# Patient Record
Sex: Female | Born: 1945 | Race: White | Hispanic: No | Marital: Married | Smoking: Never smoker
Health system: Southern US, Community
[De-identification: ages and names within clinical notes are randomized; demographics above are authoritative.]

## PROBLEM LIST (undated history)

## (undated) DIAGNOSIS — R079 Chest pain, unspecified: Secondary | ICD-10-CM

## (undated) DIAGNOSIS — G453 Amaurosis fugax: Secondary | ICD-10-CM

## (undated) DIAGNOSIS — T8859XA Other complications of anesthesia, initial encounter: Secondary | ICD-10-CM

## (undated) DIAGNOSIS — M810 Age-related osteoporosis without current pathological fracture: Secondary | ICD-10-CM

## (undated) DIAGNOSIS — I2510 Atherosclerotic heart disease of native coronary artery without angina pectoris: Secondary | ICD-10-CM

## (undated) DIAGNOSIS — E039 Hypothyroidism, unspecified: Secondary | ICD-10-CM

## (undated) DIAGNOSIS — I6529 Occlusion and stenosis of unspecified carotid artery: Secondary | ICD-10-CM

## (undated) DIAGNOSIS — I10 Essential (primary) hypertension: Secondary | ICD-10-CM

## (undated) DIAGNOSIS — E785 Hyperlipidemia, unspecified: Secondary | ICD-10-CM

## (undated) DIAGNOSIS — E042 Nontoxic multinodular goiter: Secondary | ICD-10-CM

## (undated) DIAGNOSIS — R9439 Abnormal result of other cardiovascular function study: Secondary | ICD-10-CM

## (undated) DIAGNOSIS — H409 Unspecified glaucoma: Secondary | ICD-10-CM

## (undated) DIAGNOSIS — K219 Gastro-esophageal reflux disease without esophagitis: Secondary | ICD-10-CM

## (undated) DIAGNOSIS — G43909 Migraine, unspecified, not intractable, without status migrainosus: Secondary | ICD-10-CM

## (undated) DIAGNOSIS — M1990 Unspecified osteoarthritis, unspecified site: Secondary | ICD-10-CM

## (undated) DIAGNOSIS — T4145XA Adverse effect of unspecified anesthetic, initial encounter: Secondary | ICD-10-CM

## (undated) HISTORY — DX: Hyperlipidemia, unspecified: E78.5

## (undated) HISTORY — DX: Atherosclerotic heart disease of native coronary artery without angina pectoris: I25.10

## (undated) HISTORY — DX: Gastro-esophageal reflux disease without esophagitis: K21.9

## (undated) HISTORY — DX: Age-related osteoporosis without current pathological fracture: M81.0

## (undated) HISTORY — DX: Nontoxic multinodular goiter: E04.2

## (undated) HISTORY — PX: COLONOSCOPY: SHX174

## (undated) HISTORY — DX: Migraine, unspecified, not intractable, without status migrainosus: G43.909

## (undated) HISTORY — DX: Essential (primary) hypertension: I10

## (undated) HISTORY — DX: Occlusion and stenosis of unspecified carotid artery: I65.29

## (undated) HISTORY — PX: TUBAL LIGATION: SHX77

## (undated) HISTORY — DX: Chest pain, unspecified: R07.9

## (undated) HISTORY — PX: WISDOM TOOTH EXTRACTION: SHX21

## (undated) HISTORY — DX: Unspecified glaucoma: H40.9

## (undated) HISTORY — DX: Amaurosis fugax: G45.3

## (undated) HISTORY — DX: Abnormal result of other cardiovascular function study: R94.39

---
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
## 2004-10-18 ENCOUNTER — Encounter: Admission: RE | Admit: 2004-10-18 | Discharge: 2004-10-18 | Payer: Self-pay | Admitting: Internal Medicine

## 2005-04-17 ENCOUNTER — Encounter: Admission: RE | Admit: 2005-04-17 | Discharge: 2005-04-17 | Payer: Self-pay | Admitting: Internal Medicine

## 2005-05-23 ENCOUNTER — Encounter: Admission: RE | Admit: 2005-05-23 | Discharge: 2005-05-23 | Payer: Self-pay | Admitting: Internal Medicine

## 2005-05-23 ENCOUNTER — Encounter (INDEPENDENT_AMBULATORY_CARE_PROVIDER_SITE_OTHER): Payer: Self-pay | Admitting: Specialist

## 2005-05-23 ENCOUNTER — Other Ambulatory Visit: Admission: RE | Admit: 2005-05-23 | Discharge: 2005-05-23 | Payer: Self-pay | Admitting: Interventional Radiology

## 2005-07-16 ENCOUNTER — Encounter: Admission: RE | Admit: 2005-07-16 | Discharge: 2005-07-16 | Payer: Self-pay | Admitting: Internal Medicine

## 2005-09-15 IMAGING — US US BIOPSY
1 series · 10 of 10 positions shown · non-contrast
Comparison: none

CLINICAL DATA: Thyroid goiter.  Enlarging solid lesion in left upper pole.  Requested to perform fine needle aspiration.
 ULTRASOUND GUIDED FINE NEEDLE ASPIRATION LEFT THYROID:
 Comparison thyroid ultrasound 04/17/05 ? [REDACTED].

[Series 1: unknown · 0.06mm/px · 10 of 10 slices shown]
[im 1/10]
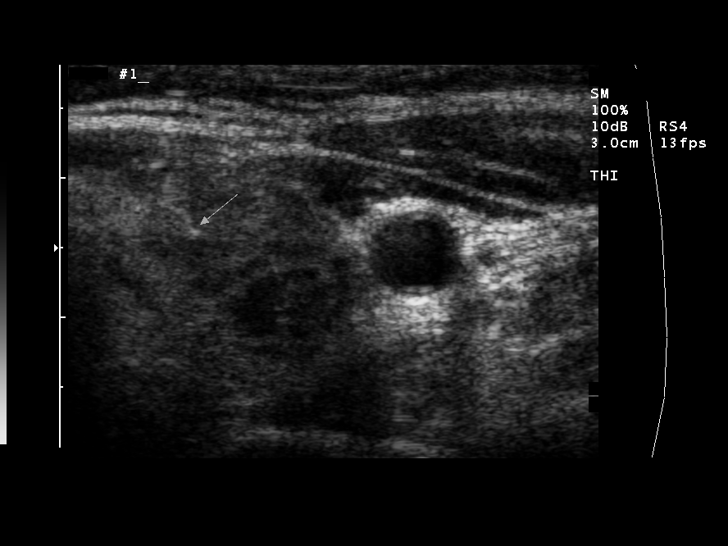
[im 2/10]
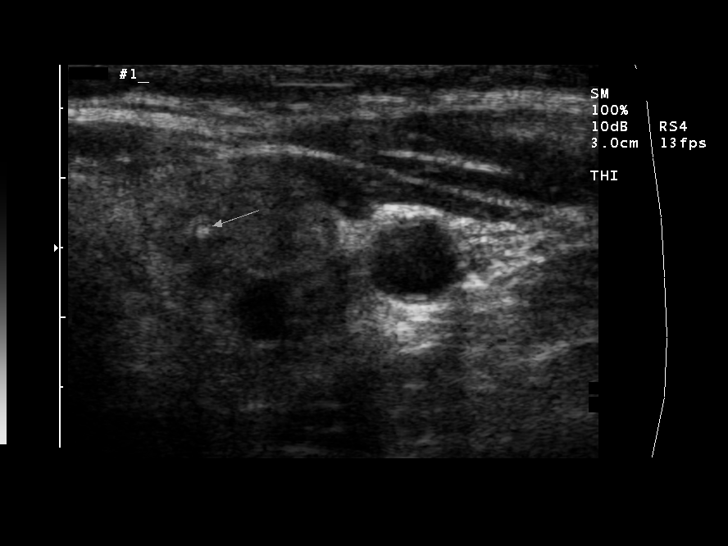
[im 3/10]
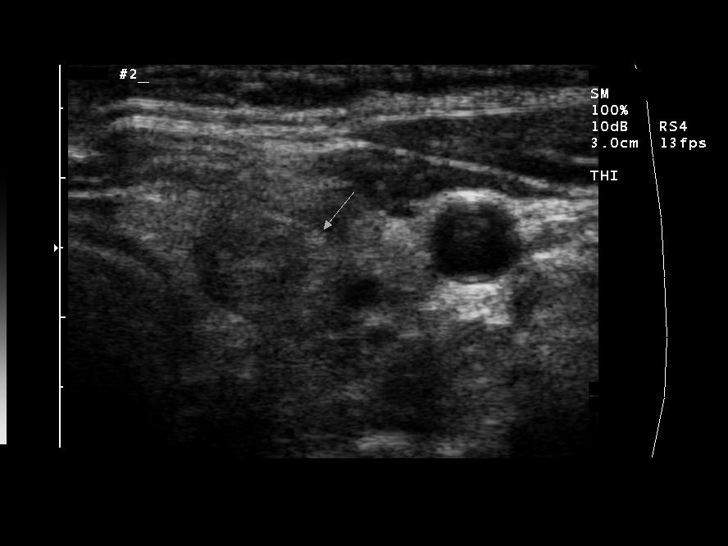
[im 4/10]
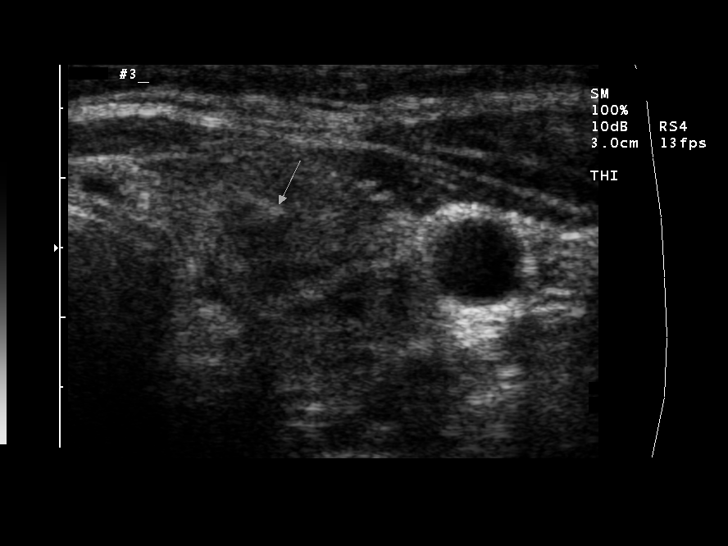
[im 5/10]
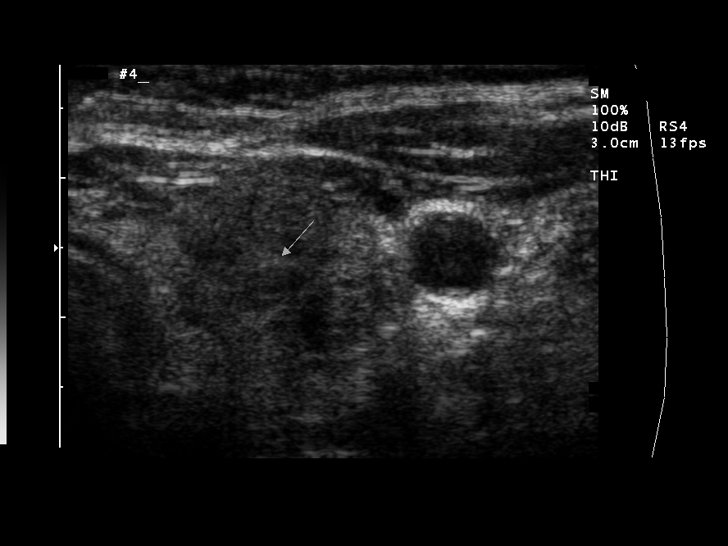
[im 6/10]
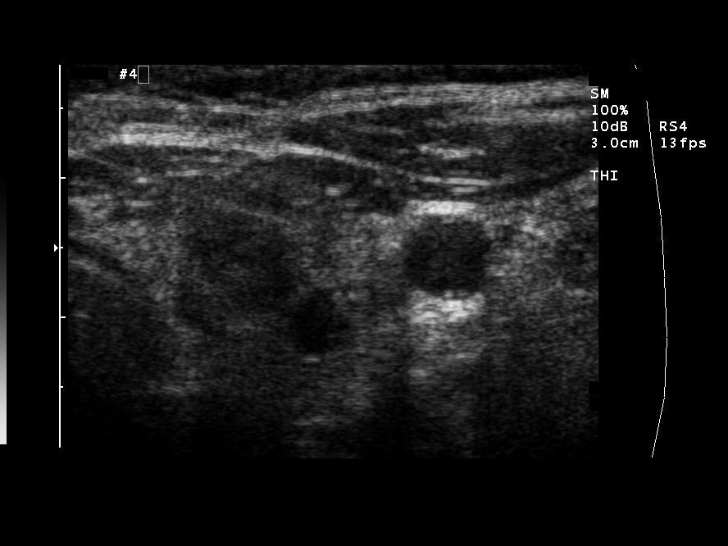
[im 7/10]
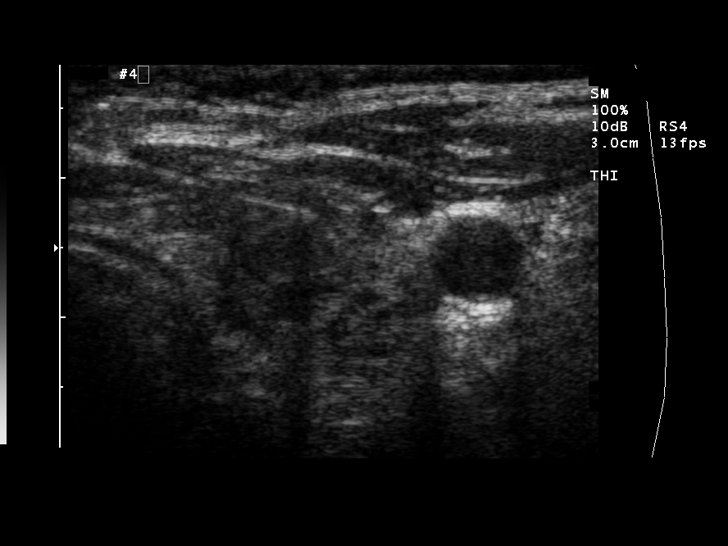
[im 8/10]
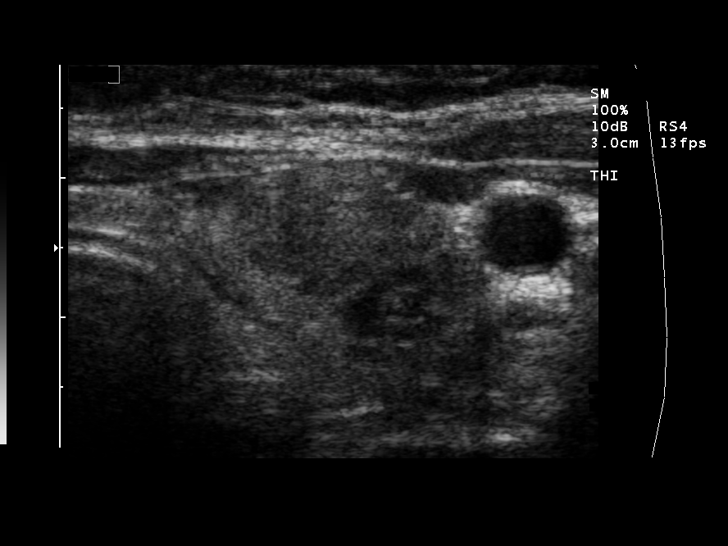
[im 9/10]
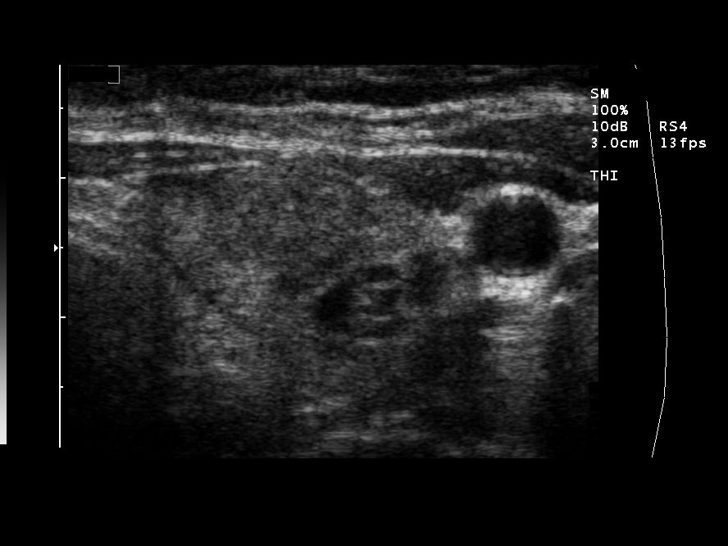
[im 10/10]
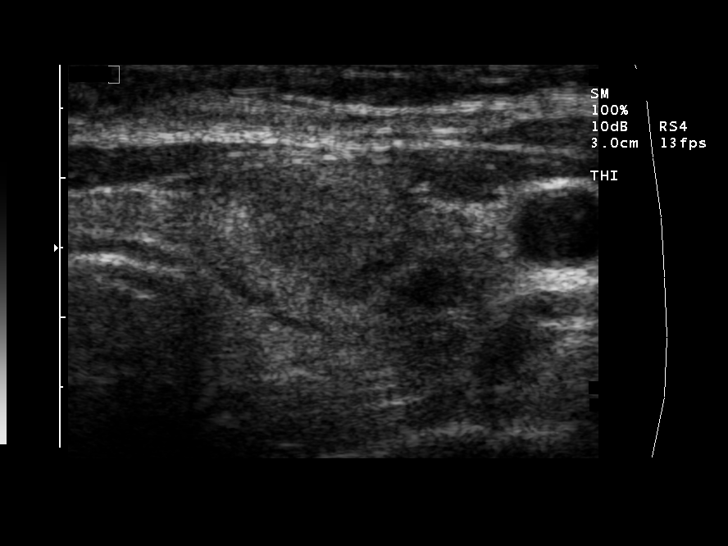

[10 of 10 positions shown; findings below may reference images not displayed]

FINDINGS: Informed consent was obtained from the patient prior to the procedure. 
 Ultrasound was first performed to localize and mark the nodule within the left upper thyroid.  The patient was then prepped and draped in a normal sterile fashion, 1% Lidocaine was used for local anesthesia.  Using direct ultrasound guidance, four passes were made using a 25 gauge hypodermic needle into this nodule. Ultrasound confirmed placement of the needle on all four occasions.  The specimens were given to pathology for further analysis.  Post procedure imaging demonstrated no hematoma or immediate complication. The patient tolerated the procedure well.
IMPRESSION: Ultrasound guided fine needle aspiration solid nodule within left upper thyroid.  Final pathology pending.

## 2006-08-16 ENCOUNTER — Encounter: Admission: RE | Admit: 2006-08-16 | Discharge: 2006-08-16 | Payer: Self-pay | Admitting: Internal Medicine

## 2006-12-09 ENCOUNTER — Encounter: Admission: RE | Admit: 2006-12-09 | Discharge: 2006-12-09 | Payer: Self-pay | Admitting: Internal Medicine

## 2006-12-09 ENCOUNTER — Encounter: Payer: Self-pay | Admitting: Internal Medicine

## 2006-12-12 ENCOUNTER — Encounter: Admission: RE | Admit: 2006-12-12 | Discharge: 2006-12-12 | Payer: Self-pay | Admitting: Internal Medicine

## 2008-01-16 ENCOUNTER — Encounter: Admission: RE | Admit: 2008-01-16 | Discharge: 2008-01-16 | Payer: Self-pay | Admitting: Internal Medicine

## 2008-11-23 ENCOUNTER — Encounter: Admission: RE | Admit: 2008-11-23 | Discharge: 2008-11-23 | Payer: Self-pay | Admitting: Internal Medicine

## 2009-05-17 ENCOUNTER — Encounter: Admission: RE | Admit: 2009-05-17 | Discharge: 2009-05-17 | Payer: Self-pay | Admitting: Internal Medicine

## 2009-11-21 ENCOUNTER — Encounter: Admission: RE | Admit: 2009-11-21 | Discharge: 2009-11-21 | Payer: Self-pay | Admitting: Internal Medicine

## 2009-12-06 ENCOUNTER — Encounter: Admission: RE | Admit: 2009-12-06 | Discharge: 2009-12-06 | Payer: Self-pay | Admitting: Internal Medicine

## 2009-12-06 ENCOUNTER — Other Ambulatory Visit: Admission: RE | Admit: 2009-12-06 | Discharge: 2009-12-06 | Payer: Self-pay | Admitting: Interventional Radiology

## 2010-09-28 ENCOUNTER — Encounter: Admission: RE | Admit: 2010-09-28 | Discharge: 2010-09-28 | Payer: Self-pay | Admitting: Internal Medicine

## 2011-01-21 ENCOUNTER — Encounter: Payer: Self-pay | Admitting: Internal Medicine

## 2012-05-02 ENCOUNTER — Other Ambulatory Visit: Payer: Self-pay | Admitting: Internal Medicine

## 2012-05-02 DIAGNOSIS — E049 Nontoxic goiter, unspecified: Secondary | ICD-10-CM

## 2012-05-09 ENCOUNTER — Ambulatory Visit
Admission: RE | Admit: 2012-05-09 | Discharge: 2012-05-09 | Disposition: A | Discharge status: Home / Self Care | Payer: Medicare Other | Source: Ambulatory Visit | Attending: Internal Medicine | Admitting: Internal Medicine

## 2012-05-09 DIAGNOSIS — E049 Nontoxic goiter, unspecified: Secondary | ICD-10-CM

## 2012-05-20 ENCOUNTER — Other Ambulatory Visit: Payer: Self-pay | Admitting: Internal Medicine

## 2012-05-20 DIAGNOSIS — E041 Nontoxic single thyroid nodule: Secondary | ICD-10-CM

## 2012-05-29 ENCOUNTER — Ambulatory Visit
Admission: RE | Admit: 2012-05-29 | Discharge: 2012-05-29 | Disposition: A | Discharge status: Home / Self Care | Payer: Medicare Other | Source: Ambulatory Visit | Attending: Internal Medicine | Admitting: Internal Medicine

## 2012-05-29 ENCOUNTER — Other Ambulatory Visit (HOSPITAL_COMMUNITY)
Admission: RE | Admit: 2012-05-29 | Discharge: 2012-05-29 | Disposition: A | Discharge status: Home / Self Care | Payer: Medicare Other | Source: Ambulatory Visit | Attending: Radiology | Admitting: Radiology

## 2012-05-29 DIAGNOSIS — E041 Nontoxic single thyroid nodule: Secondary | ICD-10-CM

## 2012-05-29 DIAGNOSIS — E049 Nontoxic goiter, unspecified: Secondary | ICD-10-CM | POA: Insufficient documentation

## 2012-05-29 NOTE — Procedures (Signed)
US guided FNA X 4 right lower pole thyroid nodule via 25 gauge needles. No immediate complications. Pathology pending.

## 2012-08-11 ENCOUNTER — Ambulatory Visit
Admission: RE | Admit: 2012-08-11 | Discharge: 2012-08-11 | Disposition: A | Discharge status: Home / Self Care | Payer: Medicare Other | Source: Ambulatory Visit | Attending: Internal Medicine | Admitting: Internal Medicine

## 2012-08-11 ENCOUNTER — Other Ambulatory Visit: Payer: Self-pay | Admitting: Internal Medicine

## 2012-08-11 DIAGNOSIS — R51 Headache: Secondary | ICD-10-CM

## 2013-03-10 ENCOUNTER — Other Ambulatory Visit: Payer: Self-pay | Admitting: Internal Medicine

## 2013-03-19 ENCOUNTER — Ambulatory Visit
Admission: RE | Admit: 2013-03-19 | Discharge: 2013-03-19 | Disposition: A | Discharge status: Home / Self Care | Payer: Medicare Other | Source: Ambulatory Visit | Attending: Internal Medicine | Admitting: Internal Medicine

## 2013-03-19 DIAGNOSIS — M810 Age-related osteoporosis without current pathological fracture: Secondary | ICD-10-CM

## 2013-03-30 ENCOUNTER — Other Ambulatory Visit: Payer: Self-pay | Admitting: Internal Medicine

## 2013-03-30 ENCOUNTER — Ambulatory Visit
Admission: RE | Admit: 2013-03-30 | Discharge: 2013-03-30 | Disposition: A | Discharge status: Home / Self Care | Payer: Medicare Other | Source: Ambulatory Visit | Attending: Internal Medicine | Admitting: Internal Medicine

## 2013-03-30 DIAGNOSIS — R509 Fever, unspecified: Secondary | ICD-10-CM

## 2013-03-30 DIAGNOSIS — R05 Cough: Secondary | ICD-10-CM

## 2014-02-04 ENCOUNTER — Other Ambulatory Visit: Payer: Self-pay | Admitting: Internal Medicine

## 2014-02-04 ENCOUNTER — Ambulatory Visit
Admission: RE | Admit: 2014-02-04 | Discharge: 2014-02-04 | Disposition: A | Discharge status: Home / Self Care | Payer: 59 | Source: Ambulatory Visit | Attending: Internal Medicine | Admitting: Internal Medicine

## 2014-02-04 DIAGNOSIS — R52 Pain, unspecified: Secondary | ICD-10-CM

## 2014-02-04 DIAGNOSIS — W19XXXA Unspecified fall, initial encounter: Secondary | ICD-10-CM

## 2014-05-13 ENCOUNTER — Ambulatory Visit (INDEPENDENT_AMBULATORY_CARE_PROVIDER_SITE_OTHER): Payer: 59 | Admitting: Otolaryngology

## 2016-01-09 DIAGNOSIS — R9439 Abnormal result of other cardiovascular function study: Secondary | ICD-10-CM

## 2016-01-09 DIAGNOSIS — R079 Chest pain, unspecified: Secondary | ICD-10-CM

## 2016-01-09 HISTORY — DX: Abnormal result of other cardiovascular function study: R94.39

## 2016-01-09 HISTORY — DX: Chest pain, unspecified: R07.9

## 2016-01-17 HISTORY — PX: CARDIAC CATHETERIZATION: SHX172

## 2016-01-30 DIAGNOSIS — I2510 Atherosclerotic heart disease of native coronary artery without angina pectoris: Secondary | ICD-10-CM

## 2016-01-30 HISTORY — DX: Atherosclerotic heart disease of native coronary artery without angina pectoris: I25.10

## 2018-02-03 DIAGNOSIS — R079 Chest pain, unspecified: Secondary | ICD-10-CM | POA: Diagnosis not present

## 2018-02-04 DIAGNOSIS — R079 Chest pain, unspecified: Secondary | ICD-10-CM | POA: Diagnosis not present

## 2018-02-06 DIAGNOSIS — G453 Amaurosis fugax: Secondary | ICD-10-CM

## 2018-02-06 DIAGNOSIS — K219 Gastro-esophageal reflux disease without esophagitis: Secondary | ICD-10-CM | POA: Insufficient documentation

## 2018-02-06 DIAGNOSIS — E042 Nontoxic multinodular goiter: Secondary | ICD-10-CM

## 2018-02-06 DIAGNOSIS — M810 Age-related osteoporosis without current pathological fracture: Secondary | ICD-10-CM

## 2018-02-06 DIAGNOSIS — G43909 Migraine, unspecified, not intractable, without status migrainosus: Secondary | ICD-10-CM

## 2018-02-06 DIAGNOSIS — I10 Essential (primary) hypertension: Secondary | ICD-10-CM | POA: Insufficient documentation

## 2018-02-06 DIAGNOSIS — H409 Unspecified glaucoma: Secondary | ICD-10-CM

## 2018-02-06 DIAGNOSIS — E785 Hyperlipidemia, unspecified: Secondary | ICD-10-CM

## 2018-02-06 DIAGNOSIS — I6529 Occlusion and stenosis of unspecified carotid artery: Secondary | ICD-10-CM

## 2018-02-06 HISTORY — DX: Gastro-esophageal reflux disease without esophagitis: K21.9

## 2018-02-06 HISTORY — DX: Migraine, unspecified, not intractable, without status migrainosus: G43.909

## 2018-02-06 HISTORY — DX: Hyperlipidemia, unspecified: E78.5

## 2018-02-06 HISTORY — DX: Occlusion and stenosis of unspecified carotid artery: I65.29

## 2018-02-06 HISTORY — DX: Essential (primary) hypertension: I10

## 2018-02-06 HISTORY — DX: Nontoxic multinodular goiter: E04.2

## 2018-02-06 HISTORY — DX: Unspecified glaucoma: H40.9

## 2018-02-06 HISTORY — DX: Amaurosis fugax: G45.3

## 2018-02-06 HISTORY — DX: Age-related osteoporosis without current pathological fracture: M81.0

## 2018-02-11 NOTE — Progress Notes (Signed)
Cardiology Office Note:    Date:  02/12/2018   ID:  Andrea Ewing, DOB 11-24-1946, MRN [id number]  PCP:  Ralene OkMoreira, Roy, MD  Cardiologist:  Norman HerrlichBrian Alijah Hyde, MD    Referring MD: No ref. provider found    ASSESSMENT:    1. Mild CAD   2. Essential hypertension   3. Hyperlipidemia, unspecified hyperlipidemia type    PLAN:    In order of problems listed above:  1. Stable continue current medical treatment.  In particular I do not think she requires a redo coronary angiography.  In the future I asked her to wait at least 1-2 hours after resting on the couch taking nitroglycerin to avoid repeat symptomatic hypotension. 2. Stable continue current treatment 3. Stable continue her statin   Next appointment: 6 months   Medication Adjustments/Labs and Tests Ordered: Current medicines are reviewed at length with the patient today.  Concerns regarding medicines are outlined above.  No orders of the defined types were placed in this encounter.  No orders of the defined types were placed in this encounter.   Chief Complaint  Patient presents with  . Chest Pain    recent [facility] admission and MPI    History of Present Illness:    Andrea Ewing is a 72 y.o. female with a hx of mild nonobstructive CAD at angiography January 2017, Dyslipidemia, HTN   last seen 04/04/17.[facility] with chest pain.Troponin was normal, stress MPI was normal.  Left heart cath 01/17/16: Conclusions  Diagnostic Procedure Summary  Mild non obstructive disease in the LAD and LCX  Cardiac Arteries and Lesion Findings  LMCA: Mild luminal irregularities < 20%  LAD: 30 % mid LAD  LCx: LCX 30%  RCA: Mild luminal irregularities < 20%  Compliance with diet, lifestyle and medications: yes  She relates to me that on the day of admission to the hospital she had angina in the morning relieved with nitroglycerin within an hour she was in the urology office statin mobile for 30 minutes and became  very weak felt as if she would faint and had intense nausea because of this she went to the emergency room and was admitted.  She is not having frequent angina.  She complains bitterly of bowel dysfunction with severe constipation bloating abdominal distention and reflux.  She request referral to gastroenterology. Past Medical History:  Diagnosis Date  . Abnormal myocardial perfusion study 01/09/2016   Overview:  4.6 Mets, EKG normal. inferobasilar ischemia, EF 74%  . 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 Carotid atherosclerosis 02/06/2018  . Chest pain in adult 01/09/2016  . Essential hypertension 02/06/2018  . GERD (gastroesophageal reflux disease) 02/06/2018  . Glaucoma 02/06/2018  . Hyperlipidemia 02/06/2018  . Migraine 02/06/2018  . Mild CAD 01/30/2016   Overview:  Cardiac cath 01/17/16:Conclusions Diagnostic Procedure Summary Mild non obstructive disease in the LAD and LCX  . Multinodular goiter 02/06/2018  . Osteoporosis 02/06/2018    Past Surgical History:  Procedure Laterality Date  . CARDIAC CATHETERIZATION  01/17/2016    Current Medications: Current Meds  Medication Sig  . amLODipine-benazepril (LOTREL) 5-10 MG capsule Take 1 capsule by mouth daily.  Marland Kitchen. aspirin EC 81 MG tablet Take 81 mg by mouth daily.  Marland Kitchen. atorvastatin (LIPITOR) 20 MG tablet Take 20 mg by mouth daily.  . bimatoprost (LUMIGAN) 0.01 % SOLN Place 1 drop into both eyes at bedtime.  Marland Kitchen. lactulose (CHRONULAC) 10 GM/15ML solution   . metoprolol tartrate (LOPRESSOR) 25 MG tablet  Take 1 tablet by mouth 2 (two) times daily.  . nitroGLYCERIN (NITROSTAT) 0.4 MG SL tablet Place 1 tablet under the tongue every 5 (five) minutes as needed.  . sucralfate (CARAFATE) 1 g tablet Take 1 tablet by mouth daily.     Allergies:   Patient has no known allergies.   Social History   Socioeconomic History  . Marital status: Married    Spouse name: None  . Number of children: None  . Years of education: None  . Highest education level: None  Social Needs    . Financial resource strain: None  . Food insecurity - worry: None  . Food insecurity - inability: None  . Transportation needs - medical: None  . Transportation needs - non-medical: None  Occupational History  . None  Tobacco Use  . Smoking status: Never Smoker  . Smokeless tobacco: Never Used  Substance and Sexual Activity  . Alcohol use: No    Frequency: Never  . Drug use: No  . Sexual activity: None  Other Topics Concern  . None  Social History Narrative  . None     Family History: The patient's family history includes COPD in her mother; Diabetes in her brother, father, and mother; Heart disease in her maternal grandfather, maternal grandmother, mother, paternal grandmother, and paternal uncle. ROS:   Please see the history of present illness.    All other systems reviewed and are negative.  EKGs/Labs/Other Studies Reviewed:    The following studies were reviewed today: [facility] at [facility] nonspecific T waves EKG:  EKG ordered today.  The ekg ordered today demonstrates [facility] and normal MPI 02/04/18 Normal EKG perfusion and LV at 7 mets. Recent Labs: troponin<0.01, CBC BMP normal No results found for requested labs within last 8760 hours.  Recent Lipid Panel No results found for: CHOL, TRIG, HDL, CHOLHDL, VLDL, LDLCALC, LDLDIRECT  Physical Exam:    VS:  BP 126/84 (BP Location: Right Arm, Patient Position: Sitting, Cuff Size: Normal)   Pulse 60   Ht 5' 3.75" (1.619 m)   Wt 182 lb 6.4 oz (82.7 kg)   SpO2 99%   BMI 31.55 kg/m     Wt Readings from Last 3 Encounters:  02/12/18 182 lb 6.4 oz (82.7 kg)     GEN:  Well nourished, well developed in no acute distress HEENT: Normal NECK: No JVD; No carotid bruits LYMPHATICS: No lymphadenopathy CARDIAC: RRR, no murmurs, rubs, gallops RESPIRATORY:  Clear to auscultation without rales, wheezing or rhonchi  ABDOMEN: Soft, non-tender, non-distended MUSCULOSKELETAL:  No edema; No deformity  SKIN: Warm and dry NEUROLOGIC:   Alert and oriented x 3 PSYCHIATRIC:  Normal affect    Signed, Norman Herrlich, MD  02/12/2018 8:51 AM    [facility]

## 2018-02-12 ENCOUNTER — Encounter: Payer: Self-pay | Admitting: Cardiology

## 2018-02-12 ENCOUNTER — Ambulatory Visit (INDEPENDENT_AMBULATORY_CARE_PROVIDER_SITE_OTHER): Payer: Medicare Other | Admitting: Cardiology

## 2018-02-12 VITALS — BP 126/84 | HR 60 | Ht 63.75 in | Wt 182.4 lb

## 2018-02-12 DIAGNOSIS — I2510 Atherosclerotic heart disease of native coronary artery without angina pectoris: Secondary | ICD-10-CM

## 2018-02-12 DIAGNOSIS — I10 Essential (primary) hypertension: Secondary | ICD-10-CM

## 2018-02-12 DIAGNOSIS — E785 Hyperlipidemia, unspecified: Secondary | ICD-10-CM | POA: Diagnosis not present

## 2018-02-12 DIAGNOSIS — K219 Gastro-esophageal reflux disease without esophagitis: Secondary | ICD-10-CM | POA: Diagnosis not present

## 2018-02-12 NOTE — Patient Instructions (Addendum)
Medication Instructions:  Your physician recommends that you continue on your current medications as directed. Please refer to the Current Medication list given to you today.  Labwork: None  Testing/Procedures: You had an EKG today.  Follow-Up: Your physician wants you to follow-up in: 6 months. You will receive a reminder letter in the mail two months in advance. If you don't receive a letter, please call our office to schedule the follow-up appointment.  Any Other Special Instructions Will Be Listed Below (If Applicable).     If you need a refill on your cardiac medications before your next appointment, please call your pharmacy.    Nitroglycerin sublingual powder What is this medicine? NITROGLYCERIN (nye troe GLI ser in) is a type of vasodilator. It relaxes blood vessels, increasing the blood and oxygen supply to your heart. This medicine is used to prevent or relieve chest pain caused by angina. It is also used to prevent chest pain before activities like climbing stairs, going outdoors in cold weather, or sexual activity. This medicine may be used for other purposes; ask your health care provider or pharmacist if you have questions. COMMON BRAND NAME(S): GONITRO What should I tell my health care provider before I take this medicine? They need to know if you have any of these conditions: -anemia -bleeding in the brain -head injury -heart disease -liver disease -an unusual or allergic reaction to nitroglycerin, other medicines, foods, dyes, or preservatives -pregnant or trying to get pregnant -breast-feeding How should I use this medicine? This medicine is only for use in the mouth. Follow the directions on the prescription label. Hold the packet upright with the notch and red arrow line at the top of the packet. Tap the bottom of the packet so the powder settles at the bottom. Hold the packet at the notch and hold as close to your mouth as possible. Tear along the red arrow  line. Lift up your tongue. Pour all of the powder in the packet under your tongue. Close your mouth tight right away and breathe normally through your nose. Allow the powder to dissolve before you swallow. Do not rinse your mouth or spit for 5 minutes after taking this medicine. You can repeat with 1 packet every 5 minutes for up to 3 packets. If you do not feel better after 3 packets, call 911 immediately. Do not take your medicine more often than directed. If you take this medicine often to relieve symptoms of angina, your doctor or health care professional may provide you with different instructions to manage your symptoms. If symptoms do not go away after following these instructions, call 911 immediately. Do not take more than 3 packets over 15 minutes. Talk to your pediatrician regarding the use of this medicine in children. Special care may be needed. Overdosage: If you think you have taken too much of this medicine contact a poison control center or emergency room at once. NOTE: This medicine is only for you. Do not share this medicine with others. What if I miss a dose? This does not apply; this medicine is not for regular use. What may interact with this medicine? Do not take this medicine with any of the following medications: -certain medicines for erectile dysfunction like avanafil, sildenafil, tadalafil, and vardenafil -ergot alkaloids like dihydroergotamine, ergonovine, ergotamine, methylergonovine -riociguat This medicine may also interact with the following medications: -certain medicines for blood pressure This list may not describe all possible interactions. Give your health care provider a list of all the medicines,  herbs, non-prescription drugs, or dietary supplements you use. Also tell them if you smoke, drink alcohol, or use illegal drugs. Some items may interact with your medicine. What should I watch for while using this medicine? Tell your doctor or health care professional  if your symptoms do not start to get better or if you feel worse. Keep this medicine with you at all times. Sit down when you take your medicine to prevent falling if you feel dizzy or faint after using it. Try to remain calm. This will help you to feel better faster. If you feel dizzy, take several deep breaths and lie down with your feet propped up, or bend forward with your head resting between your knees. You may get drowsy or dizzy. Do not drive, use machinery, or do anything that needs mental alertness until you know how this drug affects you. Do not stand or sit up quickly, especially if you are an older patient. This reduces the risk of dizzy or fainting spells. Do not treat yourself for coughs, colds, or pain while you are taking this medicine without asking your doctor or health care professional for advice. Some ingredients may increase your blood pressure. What side effects may I notice from receiving this medicine? Side effects that you should report to your doctor or health care professional as soon as possible: -allergic reactions like skin rash, itching or hives, swelling of the face, lips, or tongue -signs and symptoms of anemia like breathing problems; dizziness; unusually weak or tired -signs and symptoms of low blood pressure like dizziness; feeling faint or lightheaded; falls; unusually weak or tired Side effects that usually do not require medical attention (report to your doctor or health care professional if they continue or are bothersome): -facial flushing -headache -nausea, vomiting This list may not describe all possible side effects. Call your doctor for medical advice about side effects. You may report side effects to FDA at 1-800-FDA-1088. Where should I keep my medicine? Keep out of the reach of children. Store between 5 and 40 degrees C (41 and 104 degrees F). Throw away any unused medicine after the expiration date. NOTE: This sheet is a summary. It may not cover  all possible information. If you have questions about this medicine, talk to your doctor, pharmacist, or health care provider.  2018 Elsevier/Gold Standard (2016-01-18 17:46:23)

## 2018-04-14 ENCOUNTER — Other Ambulatory Visit: Payer: Self-pay

## 2018-04-14 MED ORDER — METOPROLOL TARTRATE 25 MG PO TABS: 25.0000 mg | ORAL_TABLET | Freq: Two times a day (BID) | ORAL | 3 refills | Status: DC

## 2018-09-12 ENCOUNTER — Encounter: Payer: Self-pay | Admitting: *Deleted

## 2018-09-12 ENCOUNTER — Ambulatory Visit (INDEPENDENT_AMBULATORY_CARE_PROVIDER_SITE_OTHER): Payer: Medicare Other | Admitting: Cardiology

## 2018-09-12 ENCOUNTER — Encounter: Payer: Self-pay | Admitting: Cardiology

## 2018-09-12 VITALS — BP 102/68 | HR 68 | Ht 63.75 in | Wt 173.4 lb

## 2018-09-12 DIAGNOSIS — I2510 Atherosclerotic heart disease of native coronary artery without angina pectoris: Secondary | ICD-10-CM | POA: Diagnosis not present

## 2018-09-12 DIAGNOSIS — E785 Hyperlipidemia, unspecified: Secondary | ICD-10-CM | POA: Diagnosis not present

## 2018-09-12 DIAGNOSIS — I10 Essential (primary) hypertension: Secondary | ICD-10-CM

## 2018-09-12 NOTE — Patient Instructions (Signed)
Medication Instructions:  Your physician recommends that you continue on your current medications as directed. Please refer to the Current Medication list given to you today.   Labwork: None  Testing/Procedures: None  Follow-Up: Your physician wants you to follow-up in: 1 year. You will receive a reminder letter in the mail two months in advance. If you don't receive a letter, please call our office to schedule the follow-up appointment.   If you need a refill on your cardiac medications before your next appointment, please call your pharmacy.   Thank you for choosing [facility]! Apollo Timothy, RN [phone]    

## 2018-09-12 NOTE — Progress Notes (Signed)
Cardiology Office Note:    Date:  09/12/2018   ID:  Andrea Ewing, DOB [birth year], MRN [id number]  PCP:  Ralene Ok, MD  Cardiologist:  Norman Herrlich, MD    Referring MD: Ralene Ok, MD    ASSESSMENT:    1. Mild CAD   2. Hyperlipidemia, unspecified hyperlipidemia type   3. Essential hypertension    PLAN:    In order of problems listed above:  1. Her CAD is chronic stable New [place] Heart Association class I continue medical treatment including aspirin calcium channel blocker statin at this time I do not think she requires an ischemic evaluation.  I will plan to see in the office in 1 year and asked her to contact me if she is having frequent episodes of angina. 2. Stable continue her statin recent labs for liver function toxicity lipid profile FACP requested from her PCP 3. Hypertension is stable continue her current treatment calcium channel blocker   Next appointment: One year unless she is having frequent angina   Medication Adjustments/Labs and Tests Ordered: Current medicines are reviewed at length with the patient today.  Concerns regarding medicines are outlined above.  No orders of the defined types were placed in this encounter.  No orders of the defined types were placed in this encounter.   Chief Complaint  Patient presents with  . Coronary Artery Disease    History of Present Illness:    Andrea Ewing is a 72 y.o. female with a hx of mild nonobstructive CAD at angiography January 2017, Dyslipidemia, HTN   last seen 02/12/18.She was admitted to [facility] 02/03/18 with chest pain.Troponin was normal, stress MPI was normal.   Left heart cath 01/17/16: Conclusions   Diagnostic Procedure Summary   Mild non obstructive disease in the LAD and LCX   Cardiac Arteries and Lesion Findings   LMCA: Mild luminal irregularities < 20%   LAD: 30 % mid LAD   LCx: LCX 30%   RCA: Mild luminal irregularities < 20%  Compliance with diet, lifestyle and medications: Yes  She  has been troubled particularly by allergies which persisted later into the summer.  She is not short of breath no edema orthopnea chest pain palpitation or syncope and tolerates her medications without side effects.  She has had no further hospital follow-ups. Past Medical History:  Diagnosis Date  . Abnormal myocardial perfusion study 01/09/2016   Overview:  4.6 Mets, EKG normal. inferobasilar ischemia, EF 74%  . 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 Carotid atherosclerosis 02/06/2018  . Chest pain in adult 01/09/2016  . Essential hypertension 02/06/2018  . GERD (gastroesophageal reflux disease) 02/06/2018  . Glaucoma 02/06/2018  . Hyperlipidemia 02/06/2018  . Migraine 02/06/2018  . Mild CAD 01/30/2016   Overview:  Cardiac cath 01/17/16:Conclusions Diagnostic Procedure Summary Mild non obstructive disease in the LAD and LCX  . Multinodular goiter 02/06/2018  . Osteoporosis 02/06/2018    Past Surgical History:  Procedure Laterality Date  . CARDIAC CATHETERIZATION  01/17/2016    Current Medications: Current Meds  Medication Sig  . amLODipine-benazepril (LOTREL) 5-10 MG capsule Take 1 capsule by mouth daily.  Marland Kitchen aspirin EC 81 MG tablet Take 81 mg by mouth daily.  Marland Kitchen atorvastatin (LIPITOR) 20 MG tablet Take 20 mg by mouth daily.  . bimatoprost (LUMIGAN) 0.01 % SOLN Place 1 drop into both eyes at bedtime.  . Calcium Carb-Cholecalciferol (CALCIUM + VITAMIN D3 PO) Take 1 tablet by mouth daily.  Marland Kitchen denosumab (PROLIA) 60 MG/ML SOSY injection  Inject 60 mg into the skin every 6 (six) months.   . esomeprazole (NEXIUM) 40 MG capsule Take 40 mg by mouth daily.   . metoprolol tartrate (LOPRESSOR) 25 MG tablet Take 1 tablet (25 mg total) by mouth 2 (two) times daily.  . Multiple Vitamin (MULTIVITAMIN) tablet Take 1 tablet by mouth daily.  . nitroGLYCERIN (NITROSTAT) 0.4 MG SL tablet Place 1 tablet under the tongue every 5 (five) minutes as needed.  Marland Kitchen Peppermint Oil (IBGARD PO) Take 1 tablet by mouth 3 (three) times daily.      Allergies:   Patient has no known allergies.   Social History   Socioeconomic History  . Marital status: Married    Spouse name: Not on file  . Number of children: Not on file  . Years of education: Not on file  . Highest education level: Not on file  Occupational History  . Not on file  Social Needs  . Financial resource strain: Not on file  . Food insecurity:    Worry: Not on file    Inability: Not on file  . Transportation needs:    Medical: Not on file    Non-medical: Not on file  Tobacco Use  . Smoking status: Never Smoker  . Smokeless tobacco: Never Used  Substance and Sexual Activity  . Alcohol use: No    Frequency: Never  . Drug use: No  . Sexual activity: Not on file  Lifestyle  . Physical activity:    Days per week: Not on file    Minutes per session: Not on file  . Stress: Not on file  Relationships  . Social connections:    Talks on phone: Not on file    Gets together: Not on file    Attends religious service: Not on file    Active member of club or organization: Not on file    Attends meetings of clubs or organizations: Not on file    Relationship status: Not on file  Other Topics Concern  . Not on file  Social History Narrative  . Not on file     Family History: The patient's family history includes COPD in her mother; Diabetes in her brother, father, and mother; Heart disease in her maternal grandfather, maternal grandmother, mother, paternal grandmother, and paternal uncle. ROS:   Please see the history of present illness.    All other systems reviewed and are negative.  EKGs/Labs/Other Studies Reviewed:    The following studies were reviewed today:  EKG:  EKG ordered today.  The ekg ordered today demonstrates [facility] normal  Recent Labs: No results found for requested labs within last 8760 hours.  Recent Lipid Panel No results found for: CHOL, TRIG, HDL, CHOLHDL, VLDL, LDLCALC, LDLDIRECT  Physical Exam:    VS:  BP 102/68 (BP  Location: Right Arm, Patient Position: Sitting, Cuff Size: Normal)   Pulse 68   Ht 5' 3.75" (1.619 m)   Wt 173 lb 6.4 oz (78.7 kg)   SpO2 98%   BMI 30.00 kg/m     Wt Readings from Last 3 Encounters:  09/12/18 173 lb 6.4 oz (78.7 kg)  02/12/18 182 lb 6.4 oz (82.7 kg)     GEN:  Well nourished, well developed in no acute distress HEENT: Normal NECK: No JVD; No carotid bruits LYMPHATICS: No lymphadenopathy CARDIAC: RRR, no murmurs, rubs, gallops RESPIRATORY:  Clear to auscultation without rales, wheezing or rhonchi  ABDOMEN: Soft, non-tender, non-distended MUSCULOSKELETAL:  No edema; No deformity  SKIN:  Warm and dry NEUROLOGIC:  Alert and oriented x 3 PSYCHIATRIC:  Normal affect    Signed, Norman HerrlichBrian Katelyne Galster, MD  09/12/2018 3:07 PM    [facility]

## 2019-04-23 ENCOUNTER — Telehealth: Payer: Self-pay | Admitting: Cardiology

## 2019-04-23 MED ORDER — METOPROLOL TARTRATE 25 MG PO TABS: 25.0000 mg | ORAL_TABLET | Freq: Two times a day (BID) | ORAL | 2 refills | Status: DC

## 2019-04-23 NOTE — Telephone Encounter (Signed)
Lopressor refill sent to [facility] [address] Per pt preference

## 2019-04-23 NOTE — Telephone Encounter (Signed)
°*  STAT* If patient is at the pharmacy, call can be transferred to refill team.   1. Which medications need to be refilled? (please list name of each medication and dose if known)metoprolol tartrate (LOPRESSOR) 25 MG tablet  2. Which pharmacy/location (including street and city if local pharmacy) is medication to be sent to? 3. Do they need a 30 day or 90 day supply?  [id number] - [place], [place] - [address] [phone] (Phone) [phone] (Fax)

## 2019-07-27 DIAGNOSIS — M545 Low back pain, unspecified: Secondary | ICD-10-CM | POA: Insufficient documentation

## 2019-07-27 HISTORY — DX: Low back pain, unspecified: M54.50

## 2019-08-27 ENCOUNTER — Other Ambulatory Visit: Payer: Self-pay | Admitting: Internal Medicine

## 2019-08-27 ENCOUNTER — Ambulatory Visit
Admission: RE | Admit: 2019-08-27 | Discharge: 2019-08-27 | Disposition: A | Discharge status: Home / Self Care | Payer: Medicare Other | Source: Ambulatory Visit | Attending: Internal Medicine | Admitting: Internal Medicine

## 2019-08-27 ENCOUNTER — Other Ambulatory Visit: Payer: Self-pay

## 2019-08-27 DIAGNOSIS — R52 Pain, unspecified: Secondary | ICD-10-CM

## 2019-09-08 DIAGNOSIS — R079 Chest pain, unspecified: Secondary | ICD-10-CM

## 2019-09-08 DIAGNOSIS — I10 Essential (primary) hypertension: Secondary | ICD-10-CM | POA: Diagnosis not present

## 2019-09-08 DIAGNOSIS — E785 Hyperlipidemia, unspecified: Secondary | ICD-10-CM

## 2019-09-09 DIAGNOSIS — R079 Chest pain, unspecified: Secondary | ICD-10-CM | POA: Diagnosis not present

## 2019-09-22 DIAGNOSIS — M4310 Spondylolisthesis, site unspecified: Secondary | ICD-10-CM

## 2019-09-22 DIAGNOSIS — M4150 Other secondary scoliosis, site unspecified: Secondary | ICD-10-CM

## 2019-09-22 DIAGNOSIS — M4180 Other forms of scoliosis, site unspecified: Secondary | ICD-10-CM

## 2019-09-22 HISTORY — DX: Other secondary scoliosis, site unspecified: M41.50

## 2019-09-22 HISTORY — DX: Other forms of scoliosis, site unspecified: M41.80

## 2019-09-22 HISTORY — DX: Spondylolisthesis, site unspecified: M43.10

## 2019-09-25 ENCOUNTER — Ambulatory Visit: Payer: Self-pay | Admitting: Orthopedic Surgery

## 2019-09-29 NOTE — Progress Notes (Signed)
Cardiology Office Note:    Date:  09/30/2019   ID:  Andrea Ewing, DOB 1946/01/19, MRN [id number]  PCP:  Andrea Panda, MD  Cardiologist:  Andrea More, MD    Referring MD: Andrea Panda, MD    ASSESSMENT:    1. Mild CAD   2. Essential hypertension   3. Hyperlipidemia, unspecified hyperlipidemia type    PLAN:    In order of problems listed above:  1. See discussion under history she has stable CAD mild optimized and proceed with her planned surgery in the perioperative.  Continue her antihypertensive beta-blocker and statin.  She can stop aspirin a week prior to surgery and please tell her when she can resume from a bleeding perspective. 2. Stable hypertension continue her combination medication 3. Stable continue her statin   Next appointment: 6 months   Medication Adjustments/Labs and Tests Ordered: Current medicines are reviewed at length with the patient today.  Concerns regarding medicines are outlined above.  No orders of the defined types were placed in this encounter.  No orders of the defined types were placed in this encounter.   Chief Complaint  Patient presents with  . Follow-up    after [facility] admission  . Coronary Artery Disease    History of Present Illness:    Andrea Ewing is a 73 y.o. female with a hx of  mild nonobstructive CAD at angiography January 2017, Dyslipidemia, HTN  last seen 09/13/2019.  She is pending surgery L4-L5 decompression to be performed 10/15/2019. Compliance with diet, lifestyle and medications: Yes  Overall she is done well this was an isolated episode of chest discomfort precipitated by severe unremitting low back pain.  She has had no recurrence no evidence of acute coronary syndrome and had a normal myocardial perfusion study.  Her planned procedure is elective intermediate risk and she has stable mild CAD.  She has had a normal myocardial perfusion study and requires no further preoperative cardiology optimization.  She will  need to be off her aspirin for a week prior to surgery and please tell her when to resume postoperatively.  She tells me she will stay overnight please put her in a monitored bed check an EKG postoperative day 1 and call [facility] for any issues.  I would continue her usual cardiac medications including her antihypertensives beta-blocker and statin.  Her problems of hypertension dyslipidemia are stable.  She was admitted to [facility] 09/08/2019 to 09/09/2019 with prolonged chest pain.  Cardiac troponins were normal EKG showed no acute ischemic changes she underwent a myocardial perfusion study showed an ejection fraction of 70% and normal perfusion.  CBC was normal hemoglobin 14.4 creatinine 0.84 troponins were undetectable d-dimer was low for age at 75 and she was discharged to follow-up with cardiology as an outpatient.  I personally reviewed those records including the EKGs prior to the visit her EKG on 09/08/2019 showed sinus rhythm and was normal  Left heart catheterization 01/17/2016: Angiographic findings  Cardiac Arteries and Lesion Findings  LMCA: Mild luminal irregularities < 20%  LAD: 30 % mid LAD  LCx: LCX 30%  RCA: Mild luminal irregularities < 20%  LV function assessed IR:SWNIOE.  Ejection Fraction  - Method: LV gram. EF%: 55.   Past Medical History:  Diagnosis Date  . Abnormal myocardial perfusion study 01/09/2016   Overview:  4.6 Mets, EKG normal. inferobasilar ischemia, EF 74%  . 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 Carotid atherosclerosis 02/06/2018  . Chest pain in adult  01/09/2016  . Essential hypertension 02/06/2018  . GERD (gastroesophageal reflux disease) 02/06/2018  . Glaucoma 02/06/2018  . Hyperlipidemia 02/06/2018  . Migraine 02/06/2018  . Mild CAD 01/30/2016   Overview:  Cardiac cath 01/17/16:Conclusions Diagnostic Procedure Summary Mild non obstructive disease in the LAD and LCX  . Multinodular goiter 02/06/2018  . Osteoporosis 02/06/2018    Past Surgical  History:  Procedure Laterality Date  . CARDIAC CATHETERIZATION  01/17/2016    Current Medications: Current Meds  Medication Sig  . amLODipine-benazepril (LOTREL) 5-10 MG capsule Take 1 capsule by mouth daily.  Marland Kitchen aspirin EC 81 MG tablet Take 81 mg by mouth daily.  Marland Kitchen atorvastatin (LIPITOR) 20 MG tablet Take 20 mg by mouth daily.  . bimatoprost (LUMIGAN) 0.01 % SOLN Place 1 drop into both eyes at bedtime.  . Calcium Carb-Cholecalciferol (CALCIUM + VITAMIN D3 PO) Take 1 tablet by mouth daily.  Marland Kitchen CREON 36000 units CPEP capsule Take 1 capsule by mouth daily.  Marland Kitchen denosumab (PROLIA) 60 MG/ML SOSY injection Inject 60 mg into the skin every 6 (six) months.   . esomeprazole (NEXIUM) 40 MG capsule Take 40 mg by mouth daily.   . famotidine (PEPCID) 40 MG tablet TAKE 1 TABLET BY MOUTH EVERYDAY AT BEDTIME  . hydrochlorothiazide (HYDRODIURIL) 25 MG tablet Take 25 mg by mouth daily.  . meloxicam (MOBIC) 15 MG tablet Take 15 mg by mouth daily.  . metoprolol tartrate (LOPRESSOR) 25 MG tablet Take 1 tablet (25 mg total) by mouth 2 (two) times daily.  . Multiple Vitamin (MULTIVITAMIN) tablet Take 1 tablet by mouth daily.  . nitroGLYCERIN (NITROSTAT) 0.4 MG SL tablet Place 1 tablet under the tongue every 5 (five) minutes as needed.     Allergies:   Gabapentin   Social History   Socioeconomic History  . Marital status: Married    Spouse name: Not on file  . Number of children: Not on file  . Years of education: Not on file  . Highest education level: Not on file  Occupational History  . Not on file  Social Needs  . Financial resource strain: Not on file  . Food insecurity    Worry: Not on file    Inability: Not on file  . Transportation needs    Medical: Not on file    Non-medical: Not on file  Tobacco Use  . Smoking status: Never Smoker  . Smokeless tobacco: Never Used  Substance and Sexual Activity  . Alcohol use: No    Frequency: Never  . Drug use: No  . Sexual activity: Not on file   Lifestyle  . Physical activity    Days per week: Not on file    Minutes per session: Not on file  . Stress: Not on file  Relationships  . Social Musician on phone: Not on file    Gets together: Not on file    Attends religious service: Not on file    Active member of club or organization: Not on file    Attends meetings of clubs or organizations: Not on file    Relationship status: Not on file  Other Topics Concern  . Not on file  Social History Narrative  . Not on file     Family History: The patient's family history includes COPD in her mother; Diabetes in her brother, father, and mother; Heart disease in her maternal grandfather, maternal grandmother, mother, paternal grandmother, and paternal uncle. ROS:   Please see the history of  present illness.    All other systems reviewed and are negative.  EKGs/Labs/Other Studies Reviewed:    The following studies were reviewed today    Physical Exam:    VS:  BP 116/78 (BP Location: Right Arm, Patient Position: Sitting, Cuff Size: Normal)   Pulse 74   Temp (!) 97.5 F (36.4 C)   Ht 5' 3.25" (1.607 m)   Wt 175 lb 12.8 oz (79.7 kg)   SpO2 96%   BMI 30.90 kg/m     Wt Readings from Last 3 Encounters:  09/30/19 175 lb 12.8 oz (79.7 kg)  09/12/18 173 lb 6.4 oz (78.7 kg)  02/12/18 182 lb 6.4 oz (82.7 kg)     GEN:  Well nourished, well developed in no acute distress HEENT: Normal NECK: No JVD; No carotid bruits LYMPHATICS: No lymphadenopathy CARDIAC: RRR, no murmurs, rubs, gallops RESPIRATORY:  Clear to auscultation without rales, wheezing or rhonchi  ABDOMEN: Soft, non-tender, non-distended MUSCULOSKELETAL:  No edema; No deformity  SKIN: Warm and dry NEUROLOGIC:  Alert and oriented x 3 PSYCHIATRIC:  Normal affect    Signed, Norman HerrlichBrian Munley, MD  09/30/2019 2:07 PM    [facility]

## 2019-09-30 ENCOUNTER — Other Ambulatory Visit: Payer: Self-pay

## 2019-09-30 ENCOUNTER — Ambulatory Visit: Payer: Medicare Other | Admitting: Cardiology

## 2019-09-30 ENCOUNTER — Encounter: Payer: Self-pay | Admitting: Cardiology

## 2019-09-30 VITALS — BP 116/78 | HR 74 | Temp 97.5°F | Ht 63.25 in | Wt 175.8 lb

## 2019-09-30 DIAGNOSIS — I2510 Atherosclerotic heart disease of native coronary artery without angina pectoris: Secondary | ICD-10-CM | POA: Diagnosis not present

## 2019-09-30 DIAGNOSIS — E785 Hyperlipidemia, unspecified: Secondary | ICD-10-CM | POA: Diagnosis not present

## 2019-09-30 DIAGNOSIS — I10 Essential (primary) hypertension: Secondary | ICD-10-CM

## 2019-09-30 NOTE — Patient Instructions (Signed)

## 2019-10-06 ENCOUNTER — Ambulatory Visit: Payer: Self-pay | Admitting: Orthopedic Surgery

## 2019-10-06 NOTE — H&P (Signed)
Subjective:   Is a pleasant 73 year old female with past medical history significant for Mild CAD, hypertension, cholesterol, osteoporosis who has been suffering from low back pain and radicular right leg pain For greater than 6 months. Pain has failed to improve despite physical therapy, trochanteric bursa (right hip), epidural injections, and time. At this point, her quality-of-life continues to deteriorate secondary to pain and she would like to move forward with surgical intervention. She is scheduled for L4-5 Decompression insitu fusion on 10/15/19 [facility] with Dr. Shon Baton. We obtained preoperative clearance with the okay to discontinue aspirin one week prior to surgery and restart 2 days after. Patient was provided an LSO brace at today's visit. Patient has her appointment at [facility] for preoperative testing scheduled for Monday.  Patient Active Problem List   Diagnosis Date Noted  . Essential hypertension 02/06/2018  . Hyperlipidemia 02/06/2018  . 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 Carotid atherosclerosis 02/06/2018  . GERD (gastroesophageal reflux disease) 02/06/2018  . Glaucoma 02/06/2018  . Migraine 02/06/2018  . Multinodular goiter 02/06/2018  . Osteoporosis 02/06/2018  . Mild CAD 01/30/2016  . Abnormal myocardial perfusion study 01/09/2016  . Chest pain in adult 01/09/2016   Past Medical History:  Diagnosis Date  . Abnormal myocardial perfusion study 01/09/2016   Overview:  4.6 Mets, EKG normal. inferobasilar ischemia, EF 74%  . 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 Carotid atherosclerosis 02/06/2018  . Chest pain in adult 01/09/2016  . Essential hypertension 02/06/2018  . GERD (gastroesophageal reflux disease) 02/06/2018  . Glaucoma 02/06/2018  . Hyperlipidemia 02/06/2018  . Migraine 02/06/2018  . Mild CAD 01/30/2016   Overview:  Cardiac cath 01/17/16:Conclusions Diagnostic Procedure Summary Mild non obstructive disease in the LAD and LCX  . Multinodular goiter 02/06/2018  .  Osteoporosis 02/06/2018    Past Surgical History:  Procedure Laterality Date  . CARDIAC CATHETERIZATION  01/17/2016    Current Outpatient Medications  Medication Sig Dispense Refill Last Dose  . amLODipine-benazepril (LOTREL) 5-10 MG capsule Take 1 capsule by mouth daily.   Taking  . aspirin EC 81 MG tablet Take 81 mg by mouth daily.   Taking  . atorvastatin (LIPITOR) 20 MG tablet Take 20 mg by mouth daily.   Taking  . bimatoprost (LUMIGAN) 0.01 % SOLN Place 1 drop into both eyes at bedtime.   Taking  . Calcium Carb-Cholecalciferol (CALCIUM + VITAMIN D3 PO) Take 1 tablet by mouth daily.   Taking  . CREON 36000 units CPEP capsule Take 1 capsule by mouth daily.   Taking  . denosumab (PROLIA) 60 MG/ML SOSY injection Inject 60 mg into the skin every 6 (six) months.    Taking  . esomeprazole (NEXIUM) 40 MG capsule Take 40 mg by mouth daily.    Taking  . famotidine (PEPCID) 40 MG tablet TAKE 1 TABLET BY MOUTH EVERYDAY AT BEDTIME   Taking  . hydrochlorothiazide (HYDRODIURIL) 25 MG tablet Take 25 mg by mouth daily.   Taking  . meloxicam (MOBIC) 15 MG tablet Take 15 mg by mouth daily.   Taking  . metoprolol tartrate (LOPRESSOR) 25 MG tablet Take 1 tablet (25 mg total) by mouth 2 (two) times daily. 180 tablet 2 Taking  . Multiple Vitamin (MULTIVITAMIN) tablet Take 1 tablet by mouth daily.   Taking  . nitroGLYCERIN (NITROSTAT) 0.4 MG SL tablet Place 1 tablet under the tongue every 5 (five) minutes as needed.   Taking   No current facility-administered medications for this visit.    Allergies  Allergen Reactions  . Gabapentin Other (See Comments)    "makes me crazy"    Social History   Tobacco Use  . Smoking status: Never Smoker  . Smokeless tobacco: Never Used  Substance Use Topics  . Alcohol use: No    Frequency: Never    Family History  Problem Relation Age of Onset  . Heart disease Mother   . COPD Mother   . Diabetes Mother   . Diabetes Father   . Diabetes Brother   . Heart  disease Paternal Uncle   . Heart disease Paternal Grandmother   . Heart disease Maternal Grandmother   . Heart disease Maternal Grandfather     Review of Systems As stated in HPI  Objective:   Clinical exam: Harriett Sineancy is a pleasant individual, who appears younger than their stated age. She Is alert and orientated 3.  Heart: No shortness of breath, chest pain. RRR. No rubs, murmurs, or gallops.  Lungs: Clear auscultation bilaterally  Abdomen is soft and non-tender, negative loss of bowel and bladder control, no rebound tenderness. Bowel sounds 4  Negative: skin lesions abrasions contusions  Peripheral pulses: 2+ dorsalis pedis/posterior tibialis pulses bilaterally. Compartment soft and nontender.  Gait pattern: Slightly ataxic due to right foot and ankle neuropathic pain.  Assistive devices: None  Neuro: 5/5 motor strength bilaterally in the lower extremity. Positive numbness and dysesthesias in the right L5 dermatome. Negative nerve root tension signs in the lower extremity. Negative Babinski test, no clonus, 2+ deep tendon reflexes at the knee and Achilles.  Musculoskeletal: Significant back pain with palpation and range of motion. Pain radiates predominantly into the right gluteal region and right lower extremity in the L5 dermatome. No significant hip, knee, ankle pain with isolated joint range of motion.  X-rays of the lumbar spine: Degenerative scoliosis as well as spondylolisthesis L4-5. No acute fracture seen. Asymmetrical collapse of the disc space at L4-5 resulting in increased right-sided foraminal and lateral recess narrowing.  MRI from 05/07/19 demonstrates L4-5 facet arthrosis with right lateral recess stenosis with compression of the traversing L5 nerve root. The degenerative scoliosis is also demonstrated. He also has some disc extrusions at T12-L1, L1-2, and L4-5. Central and left disc protrusion at L2-3 borderline left L3 neural impingement. L1-2: Bilateral L2 impingement  due to mild to moderate stenosis from the central disc protrusion. Right L1 neural impingement is possible but it is a small central disc at the T12-L1 level.  Assessment:   Harriett Sineancy is a very pleasant 73 year old has had progressive back buttock and bilateral neuropathic leg pain right significantly worse than the left since February 2020. She has had physical therapy, trochanteric bursa, right hip, and epidural injections. Unfortunately none of these modalities have helped improve her quality-of-life. Patient reports that she has been been informed that she has spinal stenosis and is referred to me for further care and treatment. Patient's plain x-rays do demonstrate a degenerative scoliosis of the thoracolumbar spine, as well as a grade 1 anterolisthesis at L4-5 with right lateral recess compromise. The patient's MRI from 05/07/19 demonstrates L4-5 facet arthrosis with right lateral recess stenosis with compression of the traversing L5 nerve root. The degenerative scoliosis is also demonstrated. He also has some disc extrusions at T12-L1, L1-2, and L4-5. Central and left disc protrusion at L2-3 borderline left L3 neural impingement. L1-2: Bilateral L2 impingement due to mild to moderate stenosis from the central disc protrusion. Right L1 neural impingement is possible but it is a small central disc  at the T12-L1 level.  Plan:   At this point time the patient's clinical symptoms are more suggestive of right L5 neural compression and nerve irritation due to the spondylolisthesis and stenosis. Given the failure of conservative management I do think a right-sided hemilaminotomy and decompression is warranted. Because of the underlying spondylolisthesis and degenerative scoliosis I would like to back that up with an in situ arthrodesis. Over the surgery as well as the risks and benefits and all of her questions were encouraged and addressed.  Risks and benefits of surgery were discussed with the patient. These  include: Infection, bleeding, death, stroke, paralysis, ongoing or worse pain, need for additional surgery, leak of spinal fluid, adjacent segment degeneration requiring additional surgery, post-operative hematoma formation that can result in neurological compromise and the need for urgent/emergent re-operation. Loss in bowel and bladder control. Injury to major vessels that could result in the need for urgent abdominal surgery to stop bleeding. Risk of deep venous thrombosis (DVT) and the need for additional treatment. Recurrent disc herniation resulting in the need for revision surgery, which could include fusion surgery (utilizing instrumentation such as pedicle screws and intervertebral cages).  We have also discussed the goals of surgery to include: Goals of surgery: Reduction in pain, and improvement in quality of life.  We have also discussed the post-operative recovery period to include: bathing/showering restrictions, wound healing, activity (and driving) restrictions, medications/pain mangement.  We have also discussed post-operative redflags to include: signs and symptoms of postoperative infection, DVT/PE.  Patient will discontinue aspirin 7 days prior to surgery.  All patients questions were invited and answered.  Plan is to move forward with surgery as scheduled pending lab work performed at [facility] on Monday.  Follow-up: 2 weeks postoperatively

## 2019-10-06 NOTE — H&P (Deleted)
  The note originally documented on this encounter has been moved the the encounter in which it belongs.  

## 2019-10-09 NOTE — Pre-Procedure Instructions (Signed)
[id number] - [place], [place] - [address] AT [facility] [address] [place] [address] Phone: [phone] Fax: [phone]      Your procedure is scheduled on  10-15-19   Report to [facility] Main Entrance "A" at 0530A.M., and check in at the Admitting office.  Call this number if you have problems the morning of surgery:  [phone]  Call [phone] if you have any questions prior to your surgery date Monday-Friday 8am-4pm   Remember: Do not eat or drink after midnight the night before your surgery  Take these medicines the morning of surgery with A SIP OF WATER : metoprolol tartrate (LOPRESSOR) amLODipine-benazepril (LOTREL)  esomeprazole (NEXIUM)  acetaminophen-codeine (TYLENOL #3)as needed nitroGLYCERIN (NITROSTAT) as needed 7 days prior to surgery STOP taking any Aspirin (unless otherwise instructed by your surgeon), Aleve, Naproxen, Ibuprofen, Motrin, Advil, Goody's, BC's, all herbal medications, fish oil, and all vitamins.    The Morning of Surgery  Do not wear jewelry, make-up or nail polish.  Do not wear lotions, powders, or perfumes/colognes, or deodorant  Do not shave 48 hours prior to surgery.  Men may shave face and neck.  Do not bring valuables to the hospital.  [facility] is not responsible for any belongings or valuables.  If you are a smoker, DO NOT Smoke 24 hours prior to surgery IF you wear a CPAP at night please bring your mask, tubing, and machine the morning of surgery   Remember that you must have someone to transport you home after your surgery, and remain with you for 24 hours if you are discharged the same day.   Contacts, glasses, hearing aids, dentures or bridgework may not be worn into surgery.    Leave your suitcase in the car.  After surgery it may be brought to your room.  For patients admitted to the hospital, discharge time will be determined by your treatment team.  Patients discharged the day of  surgery will not be allowed to drive home.    Special instructions:   - Preparing For Surgery  Before surgery, you can play an important role. Because skin is not sterile, your skin needs to be as free of germs as possible. You can reduce the number of germs on your skin by washing with CHG (chlorahexidine gluconate) Soap before surgery.  CHG is an antiseptic cleaner which kills germs and bonds with the skin to continue killing germs even after washing.    Oral Hygiene is also important to reduce your risk of infection.  Remember - BRUSH YOUR TEETH THE MORNING OF SURGERY WITH YOUR REGULAR TOOTHPASTE  Please do not use if you have an allergy to CHG or antibacterial soaps. If your skin becomes reddened/irritated stop using the CHG.  Do not shave (including legs and underarms) for at least 48 hours prior to first CHG shower. It is OK to shave your face.  Please follow these instructions carefully.   1. Shower the NIGHT BEFORE SURGERY and the MORNING OF SURGERY with CHG Soap.   2. If you chose to wash your hair, wash your hair first as usual with your normal shampoo.  3. After you shampoo, rinse your hair and body thoroughly to remove the shampoo.  4. Use CHG as you would any other liquid soap. You can apply CHG directly to the skin and wash gently with a scrungie or a clean washcloth.   5. Apply the CHG Soap to your body ONLY  FROM THE NECK DOWN.  Do not use on open wounds or open sores. Avoid contact with your eyes, ears, mouth and genitals (private parts). Wash Face and genitals (private parts)  with your normal soap.   6. Wash thoroughly, paying special attention to the area where your surgery will be performed.  7. Thoroughly rinse your body with warm water from the neck down.  8. DO NOT shower/wash with your normal soap after using and rinsing off the CHG Soap.  9. Pat yourself dry with a CLEAN TOWEL.  10. Wear CLEAN PAJAMAS to bed the night before surgery, wear  comfortable clothes the morning of surgery  11. Place CLEAN SHEETS on your bed the night of your first shower and DO NOT SLEEP WITH PETS.    Day of Surgery:  Do not apply any deodorants/lotions. Please shower the morning of surgery with the CHG soap  Please wear clean clothes to the hospital/surgery center.   Remember to brush your teeth WITH YOUR REGULAR TOOTHPASTE.   Please read over the following fact sheets that you were given.

## 2019-10-12 ENCOUNTER — Ambulatory Visit (HOSPITAL_COMMUNITY)
Admission: RE | Admit: 2019-10-12 | Discharge: 2019-10-12 | Disposition: A | Discharge status: Home / Self Care | Payer: Medicare Other | Source: Ambulatory Visit | Attending: Orthopedic Surgery | Admitting: Orthopedic Surgery

## 2019-10-12 ENCOUNTER — Other Ambulatory Visit: Payer: Self-pay

## 2019-10-12 ENCOUNTER — Encounter (HOSPITAL_COMMUNITY): Payer: Self-pay

## 2019-10-12 ENCOUNTER — Encounter (HOSPITAL_COMMUNITY)
Admission: RE | Admit: 2019-10-12 | Discharge: 2019-10-12 | Disposition: A | Discharge status: Home / Self Care | Payer: Medicare Other | Source: Ambulatory Visit | Attending: Orthopedic Surgery | Admitting: Orthopedic Surgery

## 2019-10-12 ENCOUNTER — Other Ambulatory Visit (HOSPITAL_COMMUNITY)
Admission: RE | Admit: 2019-10-12 | Discharge: 2019-10-12 | Disposition: A | Discharge status: Home / Self Care | Payer: Medicare Other | Source: Ambulatory Visit | Attending: Orthopedic Surgery | Admitting: Orthopedic Surgery

## 2019-10-12 DIAGNOSIS — Z20828 Contact with and (suspected) exposure to other viral communicable diseases: Secondary | ICD-10-CM | POA: Diagnosis not present

## 2019-10-12 DIAGNOSIS — E785 Hyperlipidemia, unspecified: Secondary | ICD-10-CM | POA: Insufficient documentation

## 2019-10-12 DIAGNOSIS — I10 Essential (primary) hypertension: Secondary | ICD-10-CM | POA: Diagnosis not present

## 2019-10-12 DIAGNOSIS — M48062 Spinal stenosis, lumbar region with neurogenic claudication: Secondary | ICD-10-CM | POA: Diagnosis not present

## 2019-10-12 DIAGNOSIS — Z79899 Other long term (current) drug therapy: Secondary | ICD-10-CM | POA: Insufficient documentation

## 2019-10-12 DIAGNOSIS — Z01818 Encounter for other preprocedural examination: Secondary | ICD-10-CM | POA: Diagnosis present

## 2019-10-12 DIAGNOSIS — Z7982 Long term (current) use of aspirin: Secondary | ICD-10-CM | POA: Insufficient documentation

## 2019-10-12 DIAGNOSIS — R9431 Abnormal electrocardiogram [ECG] [EKG]: Secondary | ICD-10-CM | POA: Insufficient documentation

## 2019-10-12 DIAGNOSIS — I2510 Atherosclerotic heart disease of native coronary artery without angina pectoris: Secondary | ICD-10-CM | POA: Diagnosis not present

## 2019-10-12 DIAGNOSIS — K219 Gastro-esophageal reflux disease without esophagitis: Secondary | ICD-10-CM | POA: Diagnosis not present

## 2019-10-12 HISTORY — DX: Hypothyroidism, unspecified: E03.9

## 2019-10-12 HISTORY — DX: Unspecified osteoarthritis, unspecified site: M19.90

## 2019-10-12 HISTORY — DX: Other complications of anesthesia, initial encounter: T88.59XA

## 2019-10-12 LAB — CBC
HCT: 43.7 % (ref 36.0–46.0)
Hemoglobin: 14.9 g/dL (ref 12.0–15.0)
MCH: 31.3 pg (ref 26.0–34.0)
MCHC: 34.1 g/dL (ref 30.0–36.0)
MCV: 91.8 fL (ref 80.0–100.0)
Platelets: 247 10*3/uL (ref 150–400)
RBC: 4.76 MIL/uL (ref 3.87–5.11)
RDW: 13.9 % (ref 11.5–15.5)
WBC: 6.3 10*3/uL (ref 4.0–10.5)
nRBC: 0 % (ref 0.0–0.2)

## 2019-10-12 LAB — TYPE AND SCREEN
ABO/RH(D): O POS
Antibody Screen: NEGATIVE

## 2019-10-12 LAB — URINALYSIS, ROUTINE W REFLEX MICROSCOPIC
Bilirubin Urine: NEGATIVE
Glucose, UA: NEGATIVE mg/dL
Hgb urine dipstick: NEGATIVE
Ketones, ur: NEGATIVE mg/dL
Leukocytes,Ua: NEGATIVE
Nitrite: NEGATIVE
Protein, ur: NEGATIVE mg/dL
Specific Gravity, Urine: 1.029 (ref 1.005–1.030)
pH: 5 (ref 5.0–8.0)

## 2019-10-12 LAB — SARS CORONAVIRUS 2 (TAT 6-24 HRS): SARS Coronavirus 2: NEGATIVE

## 2019-10-12 LAB — BASIC METABOLIC PANEL
Anion gap: 10 (ref 5–15)
BUN: 15 mg/dL (ref 8–23)
CO2: 26 mmol/L (ref 22–32)
Calcium: 9.9 mg/dL (ref 8.9–10.3)
Chloride: 101 mmol/L (ref 98–111)
Creatinine, Ser: 0.89 mg/dL (ref 0.44–1.00)
GFR calc Af Amer: >60 mL/min (ref >60)
GFR calc non Af Amer: >60 mL/min (ref >60)
Glucose, Bld: 119 mg/dL — ABNORMAL HIGH (ref 70–99)
Potassium: 3.5 mmol/L (ref 3.5–5.1)
Sodium: 137 mmol/L (ref 135–145)

## 2019-10-12 LAB — PROTIME-INR
INR: 1 (ref 0.8–1.2)
Prothrombin Time: 13.1 seconds (ref 11.4–15.2)

## 2019-10-12 LAB — SURGICAL PCR SCREEN
MRSA, PCR: NEGATIVE
Staphylococcus aureus: NEGATIVE

## 2019-10-12 LAB — ABO/RH: ABO/RH(D): O POS

## 2019-10-12 LAB — APTT: aPTT: 26 seconds (ref 24–36)

## 2019-10-12 NOTE — Progress Notes (Addendum)
PCP - Jilda Panda Cardiologist - Dr. Bettina Gavia   Chest x-ray - 10-12-19 EKG - 10-12-19 Stress Test - 02-04-18  Cardiac Cath - 01-17-16  Aspirin Instructions: Last dose was 10-06-19, per pt   COVID TEST- 10-12-19   Anesthesia review: yes, per Dr. Kayleen Memos, & EKG  Patient denies shortness of breath, fever, cough and chest pain at PAT appointment   Patient verbalized understanding of instructions that were given to them at the PAT appointment. Patient was also instructed that they will need to review over the PAT instructions again at home before surgery.

## 2019-10-13 NOTE — Anesthesia Preprocedure Evaluation (Addendum)
Anesthesia Evaluation  Patient identified by MRN, date of birth, ID band Patient awake    Reviewed: Allergy & Precautions, NPO status , Patient's Chart, lab work & pertinent test results  Airway Mallampati: I  TM Distance: >3 FB Neck ROM: Full    Dental  (+) Teeth Intact, Dental Advisory Given   Pulmonary neg pulmonary ROS,    breath sounds clear to auscultation       Cardiovascular hypertension, Pt. on medications and Pt. on home beta blockers + CAD   Rhythm:Regular Rate:Normal     Neuro/Psych  Headaches, negative psych ROS   GI/Hepatic Neg liver ROS, GERD  Medicated,  Endo/Other  Hypothyroidism   Renal/GU negative Renal ROS     Musculoskeletal  (+) Arthritis ,   Abdominal Normal abdominal exam  (+)   Peds  Hematology negative hematology ROS (+)   Anesthesia Other Findings   Reproductive/Obstetrics                           Lab Results  Component Value Date   WBC 6.3 10/12/2019   HGB 14.9 10/12/2019   HCT 43.7 10/12/2019   MCV 91.8 10/12/2019   PLT 247 10/12/2019   Lab Results  Component Value Date   CREATININE 0.89 10/12/2019   BUN 15 10/12/2019   NA 137 10/12/2019   K 3.5 10/12/2019   CL 101 10/12/2019   CO2 26 10/12/2019   Lab Results  Component Value Date   INR 1.0 10/12/2019   EKG: normal sinus rhythm.  Anesthesia Physical Anesthesia Plan  ASA: II  Anesthesia Plan: General   Post-op Pain Management:    Induction: Intravenous  PONV Risk Score and Plan: 4 or greater and Ondansetron, Dexamethasone and Treatment may vary due to age or medical condition  Airway Management Planned: Oral ETT  Additional Equipment: None  Intra-op Plan:   Post-operative Plan: Extubation in OR  Informed Consent: I have reviewed the patients History and Physical, chart, labs and discussed the procedure including the risks, benefits and alternatives for the proposed anesthesia with  the patient or authorized representative who has indicated his/her understanding and acceptance.       Plan Discussed with: CRNA  Anesthesia Plan Comments: (Follows with Dr. 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  Cardiac troponins were normal EKG showed no acute ischemic changes she underwent a myocardial perfusion study showed an ejection fraction of 70% and normal perfusion.  CBC was normal hemoglobin 14.4 creatinine 0.84 troponins were undetectable d-dimer was low for age at 72 and she was discharged to follow-up with cardiology as an outpatient.    She was seen by Dr. Dulce Sellar for 09/30/19 for preop clearance. Per note, "Her planned procedure is elective intermediate risk and she has stable mild CAD.  She has had a normal myocardial perfusion study and requires no further preoperative cardiology optimization.  She will need to be off her aspirin for a week prior to surgery and please tell her when to resume postoperatively.  She tells me she will stay overnight please put her in a monitored bed check an EKG postoperative day 1 and call [facility] heart care for any issues.  I would continue her usual cardiac medications including her antihypertensives beta-blocker and statin.  Her problems of hypertension dyslipidemia are stable."  Nuclear stress 09/08/19 (copy on chart): Impression: 1.  No reversible ischemia or infarction. 2.  Normal left ventricular wall motion. 3.  Left ventricular ejection fraction greater than 70%. 4.  Noninvasive stratification: Low.  Left heart catheterization 01/17/2016: Angiographic findings  Cardiac Arteries and Lesion Findings  LMCA: Mild luminal irregularities < 20%  LAD: 30 % mid LAD  LCx: LCX 30%  RCA: Mild luminal irregularities < 20%  LV function assessed OB:[id number].  Ejection Fraction  - Method: LV gram. EF%: 55. )     Anesthesia  Quick Evaluation

## 2019-10-14 NOTE — Progress Notes (Signed)
Anesthesia Chart Review: Follows with Dr. 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  Cardiac troponins were normal EKG showed no acute ischemic changes she underwent a myocardial perfusion study showed an ejection fraction of 70% and normal perfusion.  CBC was normal hemoglobin 14.4 creatinine 0.84 troponins were undetectable d-dimer was low for age at 41 and she was discharged to follow-up with cardiology as an outpatient.    She was seen by Dr. Bettina Gavia for 09/30/19 for preop clearance. Per note, "Her planned procedure is elective intermediate risk and she has stable mild CAD.  She has had a normal myocardial perfusion study and requires no further preoperative cardiology optimization.  She will need to be off her aspirin for a week prior to surgery and please tell her when to resume postoperatively.  She tells me she will stay overnight please put her in a monitored bed check an EKG postoperative day 1 and call [facility] heart care for any issues.  I would continue her usual cardiac medications including her antihypertensives beta-blocker and statin.  Her problems of hypertension dyslipidemia are stable."  Nuclear stress 09/08/19 (copy on chart): Impression: 1.  No reversible ischemia or infarction. 2.  Normal left ventricular wall motion. 3.  Left ventricular ejection fraction greater than 70%. 4.  Noninvasive stratification: Low.  Left heart catheterization 01/17/2016: Angiographic findings  Cardiac Arteries and Lesion Findings  LMCA: Mild luminal irregularities < 20%  LAD: 30 % mid LAD  LCx: LCX 30%  RCA: Mild luminal irregularities < 20%  LV function assessed DJ:MEQAST.  Ejection Fraction  - Method: LV gram. EF%: 8.    Andrea Ewing [facility] Short Stay [facility]/Anesthesiology Phone [phone] 10/14/2019 12:59 PM

## 2019-10-15 ENCOUNTER — Ambulatory Visit (HOSPITAL_COMMUNITY): Payer: Medicare Other | Admitting: Certified Registered Nurse Anesthetist

## 2019-10-15 ENCOUNTER — Other Ambulatory Visit: Payer: Self-pay

## 2019-10-15 ENCOUNTER — Ambulatory Visit (HOSPITAL_COMMUNITY): Payer: Medicare Other

## 2019-10-15 ENCOUNTER — Observation Stay (HOSPITAL_COMMUNITY)
Admission: RE | Admit: 2019-10-15 | Discharge: 2019-10-16 | Disposition: A | Discharge status: Home / Self Care | Payer: Medicare Other | Attending: Orthopedic Surgery | Admitting: Orthopedic Surgery

## 2019-10-15 ENCOUNTER — Encounter (HOSPITAL_COMMUNITY): Payer: Self-pay | Admitting: *Deleted

## 2019-10-15 ENCOUNTER — Encounter (HOSPITAL_COMMUNITY)
Admission: RE | Disposition: A | Discharge status: Home / Self Care | Payer: Self-pay | Source: Home / Self Care | Attending: Orthopedic Surgery

## 2019-10-15 ENCOUNTER — Ambulatory Visit (HOSPITAL_COMMUNITY): Payer: Medicare Other | Admitting: Physician Assistant

## 2019-10-15 DIAGNOSIS — M48061 Spinal stenosis, lumbar region without neurogenic claudication: Secondary | ICD-10-CM | POA: Diagnosis not present

## 2019-10-15 DIAGNOSIS — Z79899 Other long term (current) drug therapy: Secondary | ICD-10-CM | POA: Diagnosis not present

## 2019-10-15 DIAGNOSIS — M5416 Radiculopathy, lumbar region: Secondary | ICD-10-CM | POA: Diagnosis not present

## 2019-10-15 DIAGNOSIS — I6529 Occlusion and stenosis of unspecified carotid artery: Secondary | ICD-10-CM | POA: Insufficient documentation

## 2019-10-15 DIAGNOSIS — Z791 Long term (current) use of non-steroidal anti-inflammatories (NSAID): Secondary | ICD-10-CM | POA: Insufficient documentation

## 2019-10-15 DIAGNOSIS — Z419 Encounter for procedure for purposes other than remedying health state, unspecified: Secondary | ICD-10-CM

## 2019-10-15 DIAGNOSIS — H409 Unspecified glaucoma: Secondary | ICD-10-CM | POA: Diagnosis not present

## 2019-10-15 DIAGNOSIS — K219 Gastro-esophageal reflux disease without esophagitis: Secondary | ICD-10-CM | POA: Insufficient documentation

## 2019-10-15 DIAGNOSIS — I10 Essential (primary) hypertension: Secondary | ICD-10-CM | POA: Diagnosis not present

## 2019-10-15 DIAGNOSIS — M4316 Spondylolisthesis, lumbar region: Secondary | ICD-10-CM | POA: Diagnosis present

## 2019-10-15 DIAGNOSIS — I2510 Atherosclerotic heart disease of native coronary artery without angina pectoris: Secondary | ICD-10-CM | POA: Insufficient documentation

## 2019-10-15 DIAGNOSIS — E785 Hyperlipidemia, unspecified: Secondary | ICD-10-CM | POA: Insufficient documentation

## 2019-10-15 DIAGNOSIS — Z7982 Long term (current) use of aspirin: Secondary | ICD-10-CM | POA: Insufficient documentation

## 2019-10-15 DIAGNOSIS — M810 Age-related osteoporosis without current pathological fracture: Secondary | ICD-10-CM | POA: Diagnosis not present

## 2019-10-15 HISTORY — PX: LUMBAR LAMINECTOMY/DECOMPRESSION MICRODISCECTOMY: SHX5026

## 2019-10-15 LAB — POCT I-STAT, CHEM 8
BUN: 16 mg/dL (ref 8–23)
Calcium, Ion: 1.21 mmol/L (ref 1.15–1.40)
Chloride: 98 mmol/L (ref 98–111)
Creatinine, Ser: 0.9 mg/dL (ref 0.44–1.00)
Glucose, Bld: 109 mg/dL — ABNORMAL HIGH (ref 70–99)
HCT: 40 % (ref 36.0–46.0)
Hemoglobin: 13.6 g/dL (ref 12.0–15.0)
Potassium: 2.8 mmol/L — ABNORMAL LOW (ref 3.5–5.1)
Sodium: 137 mmol/L (ref 135–145)
TCO2: 26 mmol/L (ref 22–32)

## 2019-10-15 SURGERY — LUMBAR LAMINECTOMY/DECOMPRESSION MICRODISCECTOMY 1 LEVEL
Anesthesia: General | Site: Spine Lumbar

## 2019-10-15 MED ORDER — FENTANYL CITRATE (PF) 250 MCG/5ML IJ SOLN
INTRAMUSCULAR | Status: DC | PRN
  Administered 2019-10-15: 50 ug via INTRAVENOUS
  Administered 2019-10-15: 100 ug via INTRAVENOUS
  Administered 2019-10-15 (×2): 25 ug via INTRAVENOUS
  Administered 2019-10-15: 50 ug via INTRAVENOUS

## 2019-10-15 MED ORDER — LIDOCAINE 2% (20 MG/ML) 5 ML SYRINGE
INTRAMUSCULAR | Status: DC | PRN
  Administered 2019-10-15: 40 mg via INTRAVENOUS

## 2019-10-15 MED ORDER — METHOCARBAMOL 1000 MG/10ML IJ SOLN
500.0000 mg | Freq: Four times a day (QID) | INTRAVENOUS | Status: DC | PRN
  Filled 2019-10-15: qty 5

## 2019-10-15 MED ORDER — LACTATED RINGERS IV SOLN
INTRAVENOUS | Status: DC
  Administered 2019-10-15: 12:00:00 via INTRAVENOUS

## 2019-10-15 MED ORDER — ONDANSETRON HCL 4 MG/2ML IJ SOLN
INTRAMUSCULAR | Status: DC | PRN
  Administered 2019-10-15: 4 mg via INTRAVENOUS

## 2019-10-15 MED ORDER — BUPIVACAINE-EPINEPHRINE (PF) 0.25% -1:200000 IJ SOLN
INTRAMUSCULAR | Status: AC
  Filled 2019-10-15: qty 20

## 2019-10-15 MED ORDER — SODIUM CHLORIDE 0.9 % IV SOLN
INTRAVENOUS | Status: DC | PRN
  Administered 2019-10-15: 40 ug/min via INTRAVENOUS

## 2019-10-15 MED ORDER — DOCUSATE SODIUM 100 MG PO CAPS
100.0000 mg | ORAL_CAPSULE | Freq: Two times a day (BID) | ORAL | Status: DC
  Administered 2019-10-15: 100 mg via ORAL
  Filled 2019-10-15: qty 1

## 2019-10-15 MED ORDER — ACETAMINOPHEN 10 MG/ML IV SOLN
INTRAVENOUS | Status: AC
  Filled 2019-10-15: qty 100

## 2019-10-15 MED ORDER — MIDAZOLAM HCL 2 MG/2ML IJ SOLN
INTRAMUSCULAR | Status: AC
  Filled 2019-10-15: qty 2

## 2019-10-15 MED ORDER — FAMOTIDINE 20 MG PO TABS
40.0000 mg | ORAL_TABLET | Freq: Every day | ORAL | Status: DC
  Administered 2019-10-15: 40 mg via ORAL
  Filled 2019-10-15: qty 2

## 2019-10-15 MED ORDER — PHENOL 1.4 % MT LIQD: 1.0000 | OROMUCOSAL | Status: DC | PRN

## 2019-10-15 MED ORDER — MENTHOL 3 MG MT LOZG: 1.0000 | LOZENGE | OROMUCOSAL | Status: DC | PRN

## 2019-10-15 MED ORDER — ACETAMINOPHEN 325 MG PO TABS: 650.0000 mg | ORAL_TABLET | ORAL | Status: DC | PRN

## 2019-10-15 MED ORDER — SODIUM CHLORIDE 0.9% FLUSH: 3.0000 mL | INTRAVENOUS | Status: DC | PRN

## 2019-10-15 MED ORDER — AMLODIPINE BESYLATE 5 MG PO TABS: 5.0000 mg | ORAL_TABLET | Freq: Every day | ORAL | Status: DC

## 2019-10-15 MED ORDER — SUGAMMADEX SODIUM 200 MG/2ML IV SOLN
INTRAVENOUS | Status: DC | PRN
  Administered 2019-10-15: 160 mg via INTRAVENOUS

## 2019-10-15 MED ORDER — LACTATED RINGERS IV SOLN
INTRAVENOUS | Status: DC | PRN
  Administered 2019-10-15: 07:00:00 via INTRAVENOUS

## 2019-10-15 MED ORDER — ONDANSETRON HCL 4 MG PO TABS
4.0000 mg | ORAL_TABLET | Freq: Four times a day (QID) | ORAL | Status: DC | PRN
  Administered 2019-10-16: 10:00:00 4 mg via ORAL
  Filled 2019-10-15: qty 1

## 2019-10-15 MED ORDER — ACETAMINOPHEN 650 MG RE SUPP: 650.0000 mg | RECTAL | Status: DC | PRN

## 2019-10-15 MED ORDER — MORPHINE SULFATE (PF) 2 MG/ML IV SOLN
1.0000 mg | INTRAVENOUS | Status: AC | PRN
  Administered 2019-10-15: 1 mg via INTRAVENOUS
  Filled 2019-10-15: qty 1

## 2019-10-15 MED ORDER — THROMBIN 20000 UNITS EX SOLR
CUTANEOUS | Status: DC | PRN
  Administered 2019-10-15: 20 mL

## 2019-10-15 MED ORDER — BUPIVACAINE-EPINEPHRINE 0.25% -1:200000 IJ SOLN
INTRAMUSCULAR | Status: DC | PRN
  Administered 2019-10-15: 10 mL

## 2019-10-15 MED ORDER — CEFAZOLIN SODIUM-DEXTROSE 2-4 GM/100ML-% IV SOLN
2.0000 g | INTRAVENOUS | Status: AC
  Administered 2019-10-15: 2 g via INTRAVENOUS

## 2019-10-15 MED ORDER — ACETAMINOPHEN 10 MG/ML IV SOLN
INTRAVENOUS | Status: DC | PRN
  Administered 2019-10-15: 1000 mg via INTRAVENOUS

## 2019-10-15 MED ORDER — PROPOFOL 10 MG/ML IV BOLUS
INTRAVENOUS | Status: DC | PRN
  Administered 2019-10-15: 120 mg via INTRAVENOUS

## 2019-10-15 MED ORDER — PROPOFOL 10 MG/ML IV BOLUS
INTRAVENOUS | Status: AC
  Filled 2019-10-15: qty 20

## 2019-10-15 MED ORDER — ROCURONIUM BROMIDE 10 MG/ML (PF) SYRINGE
PREFILLED_SYRINGE | INTRAVENOUS | Status: DC | PRN
  Administered 2019-10-15: 20 mg via INTRAVENOUS
  Administered 2019-10-15: 60 mg via INTRAVENOUS

## 2019-10-15 MED ORDER — MAGNESIUM CITRATE PO SOLN: 1.0000 | Freq: Once | ORAL | Status: DC | PRN

## 2019-10-15 MED ORDER — ADULT MULTIVITAMIN W/MINERALS CH: 1.0000 | ORAL_TABLET | Freq: Every day | ORAL | Status: DC

## 2019-10-15 MED ORDER — POLYETHYLENE GLYCOL 3350 17 G PO PACK: 17.0000 g | PACK | Freq: Every day | ORAL | Status: DC | PRN

## 2019-10-15 MED ORDER — METOPROLOL TARTRATE 25 MG PO TABS
25.0000 mg | ORAL_TABLET | Freq: Two times a day (BID) | ORAL | Status: DC
  Administered 2019-10-15: 25 mg via ORAL
  Filled 2019-10-15: qty 1

## 2019-10-15 MED ORDER — ONDANSETRON HCL 4 MG PO TABS: 4.0000 mg | ORAL_TABLET | Freq: Three times a day (TID) | ORAL | 0 refills | Status: AC | PRN

## 2019-10-15 MED ORDER — DEXAMETHASONE SODIUM PHOSPHATE 10 MG/ML IJ SOLN
INTRAMUSCULAR | Status: DC | PRN
  Administered 2019-10-15: 10 mg via INTRAVENOUS

## 2019-10-15 MED ORDER — CEFAZOLIN SODIUM-DEXTROSE 1-4 GM/50ML-% IV SOLN
1.0000 g | Freq: Three times a day (TID) | INTRAVENOUS | Status: AC
  Administered 2019-10-15 (×2): 1 g via INTRAVENOUS
  Filled 2019-10-15 (×2): qty 50

## 2019-10-15 MED ORDER — SODIUM CHLORIDE 0.9% FLUSH
3.0000 mL | Freq: Two times a day (BID) | INTRAVENOUS | Status: DC
  Administered 2019-10-15: 3 mL via INTRAVENOUS

## 2019-10-15 MED ORDER — FENTANYL CITRATE (PF) 250 MCG/5ML IJ SOLN
INTRAMUSCULAR | Status: AC
  Filled 2019-10-15: qty 5

## 2019-10-15 MED ORDER — METHYLPREDNISOLONE ACETATE 40 MG/ML IJ SUSP
INTRAMUSCULAR | Status: AC
  Filled 2019-10-15: qty 1

## 2019-10-15 MED ORDER — ONDANSETRON HCL 4 MG/2ML IJ SOLN
4.0000 mg | Freq: Four times a day (QID) | INTRAMUSCULAR | Status: DC | PRN
  Administered 2019-10-15 – 2019-10-16 (×3): 4 mg via INTRAVENOUS
  Filled 2019-10-15 (×3): qty 2

## 2019-10-15 MED ORDER — METHYLPREDNISOLONE ACETATE 40 MG/ML IJ SUSP
INTRAMUSCULAR | Status: DC | PRN
  Administered 2019-10-15: 40 mg

## 2019-10-15 MED ORDER — METHOCARBAMOL 500 MG PO TABS: 500.0000 mg | ORAL_TABLET | Freq: Four times a day (QID) | ORAL | Status: DC | PRN

## 2019-10-15 MED ORDER — OXYCODONE HCL 5 MG PO TABS: 5.0000 mg | ORAL_TABLET | ORAL | Status: DC | PRN

## 2019-10-15 MED ORDER — HEMOSTATIC AGENTS (NO CHARGE) OPTIME
TOPICAL | Status: DC | PRN
  Administered 2019-10-15: 1

## 2019-10-15 MED ORDER — CEFAZOLIN SODIUM-DEXTROSE 2-4 GM/100ML-% IV SOLN
INTRAVENOUS | Status: AC
  Filled 2019-10-15: qty 100

## 2019-10-15 MED ORDER — MIDAZOLAM HCL 2 MG/2ML IJ SOLN
INTRAMUSCULAR | Status: DC | PRN
  Administered 2019-10-15 (×2): 1 mg via INTRAVENOUS

## 2019-10-15 MED ORDER — METHOCARBAMOL 500 MG PO TABS: 500.0000 mg | ORAL_TABLET | Freq: Three times a day (TID) | ORAL | 0 refills | Status: AC | PRN

## 2019-10-15 MED ORDER — HYDROCODONE-ACETAMINOPHEN 5-325 MG PO TABS: 1.0000 | ORAL_TABLET | Freq: Four times a day (QID) | ORAL | 0 refills | Status: AC | PRN

## 2019-10-15 MED ORDER — AMLODIPINE BESY-BENAZEPRIL HCL 5-10 MG PO CAPS: 1.0000 | ORAL_CAPSULE | Freq: Every day | ORAL | Status: DC

## 2019-10-15 MED ORDER — EPHEDRINE SULFATE 50 MG/ML IJ SOLN
INTRAMUSCULAR | Status: DC | PRN
  Administered 2019-10-15: 10 mg via INTRAVENOUS

## 2019-10-15 MED ORDER — 0.9 % SODIUM CHLORIDE (POUR BTL) OPTIME
TOPICAL | Status: DC | PRN
  Administered 2019-10-15: 1000 mL

## 2019-10-15 MED ORDER — HYDROCHLOROTHIAZIDE 25 MG PO TABS: 25.0000 mg | ORAL_TABLET | Freq: Every day | ORAL | Status: DC

## 2019-10-15 MED ORDER — BENAZEPRIL HCL 5 MG PO TABS
10.0000 mg | ORAL_TABLET | Freq: Every day | ORAL | Status: DC
  Filled 2019-10-15: qty 1
  Filled 2019-10-15: qty 2

## 2019-10-15 MED ORDER — OXYCODONE HCL 5 MG PO TABS
10.0000 mg | ORAL_TABLET | ORAL | Status: DC | PRN
  Administered 2019-10-15 (×2): 10 mg via ORAL
  Filled 2019-10-15 (×2): qty 2

## 2019-10-15 SURGICAL SUPPLY — 63 items
AGENT HMST KT MTR STRL THRMB (HEMOSTASIS)
BNDG GAUZE ELAST 4 BULKY (GAUZE/BANDAGES/DRESSINGS) ×3 IMPLANT
BUR EGG ELITE 4.0 (BURR) ×1 IMPLANT
BUR EGG ELITE 4.0MM (BURR) ×1
CANISTER SUCT 3000ML PPV (MISCELLANEOUS) ×3 IMPLANT
CLOSURE STERI-STRIP 1/2X4 (GAUZE/BANDAGES/DRESSINGS) ×1
CLSR STERI-STRIP ANTIMIC 1/2X4 (GAUZE/BANDAGES/DRESSINGS) ×2 IMPLANT
CORD BIPOLAR FORCEPS 12FT (ELECTRODE) ×3 IMPLANT
COVER SURGICAL LIGHT HANDLE (MISCELLANEOUS) ×3 IMPLANT
COVER WAND RF STERILE (DRAPES) ×3 IMPLANT
DRAIN CHANNEL 15F RND FF W/TCR (WOUND CARE) IMPLANT
DRAPE POUCH INSTRU U-SHP 10X18 (DRAPES) ×3 IMPLANT
DRAPE SURG 17X23 STRL (DRAPES) ×3 IMPLANT
DRAPE U-SHAPE 47X51 STRL (DRAPES) ×3 IMPLANT
DRSG OPSITE POSTOP 3X4 (GAUZE/BANDAGES/DRESSINGS) ×3 IMPLANT
DRSG OPSITE POSTOP 4X6 (GAUZE/BANDAGES/DRESSINGS) ×2 IMPLANT
DURAPREP 26ML APPLICATOR (WOUND CARE) ×3 IMPLANT
ELECT BLADE 4.0 EZ CLEAN MEGAD (MISCELLANEOUS)
ELECT CAUTERY BLADE 6.4 (BLADE) ×3 IMPLANT
ELECT PENCIL ROCKER SW 15FT (MISCELLANEOUS) ×3 IMPLANT
ELECT REM PT RETURN 9FT ADLT (ELECTROSURGICAL) ×3
ELECTRODE BLDE 4.0 EZ CLN MEGD (MISCELLANEOUS) IMPLANT
ELECTRODE REM PT RTRN 9FT ADLT (ELECTROSURGICAL) ×1 IMPLANT
EVACUATOR SILICONE 100CC (DRAIN) IMPLANT
GLOVE BIO SURGEON STRL SZ 6.5 (GLOVE) ×2 IMPLANT
GLOVE BIO SURGEONS STRL SZ 6.5 (GLOVE) ×1
GLOVE BIOGEL PI IND STRL 6.5 (GLOVE) ×1 IMPLANT
GLOVE BIOGEL PI IND STRL 8.5 (GLOVE) ×1 IMPLANT
GLOVE BIOGEL PI INDICATOR 6.5 (GLOVE) ×2
GLOVE BIOGEL PI INDICATOR 8.5 (GLOVE) ×2
GLOVE SS BIOGEL STRL SZ 8.5 (GLOVE) ×1 IMPLANT
GLOVE SUPERSENSE BIOGEL SZ 8.5 (GLOVE) ×2
GOWN STRL REUS W/ TWL LRG LVL3 (GOWN DISPOSABLE) ×2 IMPLANT
GOWN STRL REUS W/TWL 2XL LVL3 (GOWN DISPOSABLE) ×3 IMPLANT
GOWN STRL REUS W/TWL LRG LVL3 (GOWN DISPOSABLE) ×6
KIT BASIN OR (CUSTOM PROCEDURE TRAY) ×3 IMPLANT
KIT TURNOVER KIT B (KITS) ×3 IMPLANT
NDL SPNL 18GX3.5 QUINCKE PK (NEEDLE) ×2 IMPLANT
NEEDLE 22X1 1/2 (OR ONLY) (NEEDLE) ×3 IMPLANT
NEEDLE SPNL 18GX3.5 QUINCKE PK (NEEDLE) ×6 IMPLANT
NS IRRIG 1000ML POUR BTL (IV SOLUTION) ×3 IMPLANT
PACK LAMINECTOMY ORTHO (CUSTOM PROCEDURE TRAY) ×3 IMPLANT
PACK UNIVERSAL I (CUSTOM PROCEDURE TRAY) ×3 IMPLANT
PAD ARMBOARD 7.5X6 YLW CONV (MISCELLANEOUS) ×6 IMPLANT
PATTIES SURGICAL .5 X.5 (GAUZE/BANDAGES/DRESSINGS) ×3 IMPLANT
PATTIES SURGICAL .5 X1 (DISPOSABLE) ×3 IMPLANT
PUTTY BONE DBX 5CC MIX (Putty) ×2 IMPLANT
SPONGE SURGIFOAM ABS GEL 100 (HEMOSTASIS) ×2 IMPLANT
SURGIFLO W/THROMBIN 8M KIT (HEMOSTASIS) IMPLANT
SUT BONE WAX W31G (SUTURE) ×3 IMPLANT
SUT MON AB 3-0 SH 27 (SUTURE) ×3
SUT MON AB 3-0 SH27 (SUTURE) ×1 IMPLANT
SUT VIC AB 0 CT1 27 (SUTURE)
SUT VIC AB 0 CT1 27XBRD ANBCTR (SUTURE) IMPLANT
SUT VIC AB 1 CT1 18XCR BRD 8 (SUTURE) ×1 IMPLANT
SUT VIC AB 1 CT1 8-18 (SUTURE) ×3
SUT VIC AB 2-0 CT1 18 (SUTURE) ×3 IMPLANT
SYR BULB IRRIGATION 50ML (SYRINGE) ×3 IMPLANT
SYR CONTROL 10ML LL (SYRINGE) ×3 IMPLANT
TOWEL GREEN STERILE (TOWEL DISPOSABLE) ×3 IMPLANT
TOWEL GREEN STERILE FF (TOWEL DISPOSABLE) ×3 IMPLANT
WATER STERILE IRR 1000ML POUR (IV SOLUTION) ×3 IMPLANT
YANKAUER SUCT BULB TIP NO VENT (SUCTIONS) IMPLANT

## 2019-10-15 NOTE — Anesthesia Procedure Notes (Signed)
Procedure Name: Intubation Date/Time: 10/15/2019 7:38 AM Performed by: Clearnce Sorrel, CRNA Pre-anesthesia Checklist: Patient identified, Emergency Drugs available, Suction available, Patient being monitored and Timeout performed Patient Re-evaluated:Patient Re-evaluated prior to induction Oxygen Delivery Method: Circle system utilized Preoxygenation: Pre-oxygenation with 100% oxygen Induction Type: IV induction Ventilation: Mask ventilation without difficulty Laryngoscope Size: Mac and 3 Grade View: Grade I Tube type: Oral Tube size: 7.0 mm Number of attempts: 1 Airway Equipment and Method: Stylet Placement Confirmation: ETT inserted through vocal cords under direct vision,  positive ETCO2 and breath sounds checked- equal and bilateral Secured at: 22 cm Tube secured with: Tape Dental Injury: Teeth and Oropharynx as per pre-operative assessment

## 2019-10-15 NOTE — Transfer of Care (Signed)
Immediate Anesthesia Transfer of Care Note  Patient: Andrea Ewing  Procedure(s) Performed: Lumbar four through five Decompression insitu fusion (N/A Spine Lumbar)  Patient Location: PACU  Anesthesia Type:General  Level of Consciousness: awake, alert  and oriented  Airway & Oxygen Therapy: Patient Spontanous Breathing  Post-op Assessment: Report given to RN and Post -op Vital signs reviewed and stable  Post vital signs: Reviewed and stable  Last Vitals:  Vitals Value Taken Time  BP 100/56 10/15/19 1015  Temp    Pulse 75 10/15/19 1018  Resp 14 10/15/19 1018  SpO2 98 % 10/15/19 1018  Vitals shown include unvalidated device data.  Last Pain:  Vitals:   10/15/19 0613  TempSrc: Oral  PainSc: 0-No pain      Patients Stated Pain Goal: 2 (71/69/67 [id number])  Complications: No apparent anesthesia complications

## 2019-10-15 NOTE — Brief Op Note (Signed)
10/15/2019  10:01 AM  PATIENT:  Andrea Ewing  73 y.o. female  PRE-OPERATIVE DIAGNOSIS:  L4-5 spinal stenosis with right L5 nerve compression  POST-OPERATIVE DIAGNOSIS:  L4-5 spinal stenosis with right L5 nerve compression  PROCEDURE:  Procedure(s) with comments: Lumbar four through five Decompression insitu fusion (N/A) - 3.5 hrs  SURGEON:  Surgeon(s) and Role:    Melina Schools, MD - Primary  PHYSICIAN ASSISTANT:   ASSISTANTS: Amanda Ward, PA   ANESTHESIA:   general  EBL:  50 mL   BLOOD ADMINISTERED:none  DRAINS: none   LOCAL MEDICATIONS USED:  MARCAINE    and OTHER depromedrol  SPECIMEN:  No Specimen  DISPOSITION OF SPECIMEN:  N/A  COUNTS:  YES  TOURNIQUET:  * No tourniquets in log *  DICTATION: .Dragon Dictation  PLAN OF CARE: Admit for overnight observation  PATIENT DISPOSITION:  PACU - hemodynamically stable.

## 2019-10-15 NOTE — Evaluation (Signed)
Physical Therapy Evaluation Patient Details Name: Andrea Ewing MRN: [id number] DOB: 16-May-1946 Today's Date: 10/15/2019   History of Present Illness  Pt is a 73 y/o female s/p L4-5 decompression. PMH includes HTN, glaucoma, CAD, and osteoporosis.   Clinical Impression  Patient is s/p above surgery resulting in the deficits listed below (see PT Problem List). Pt requiring min to min guard for mobility tasks this session. Pt reporting nausea which limited mobility. Educated about back precautions, generalized walking program, and car transfer techniques. Reports husband available to assist as needed. Patient will benefit from skilled PT to increase their independence and safety with mobility (while adhering to their precautions) to allow discharge to the venue listed below.     Follow Up Recommendations No PT follow up    Equipment Recommendations  None recommended by PT    Recommendations for Other Services       Precautions / Restrictions Precautions Precautions: Back Precaution Booklet Issued: Yes (comment) Precaution Comments: reviewed back precautions with pt.  Required Braces or Orthoses: Spinal Brace Spinal Brace: Lumbar corset;Applied in sitting position Restrictions Weight Bearing Restrictions: No      Mobility  Bed Mobility Overal bed mobility: Needs Assistance Bed Mobility: Rolling;Sidelying to Sit;Sit to Sidelying Rolling: Supervision Sidelying to sit: Supervision     Sit to sidelying: Supervision General bed mobility comments: Supervision to ensure log roll technique. Cues for sequencing. increased time required secondary to pain.   Transfers Overall transfer level: Needs assistance Equipment used: None Transfers: Sit to/from Stand Sit to Stand: Min assist         General transfer comment: Min A for steadying assist. Increased time required to stand.   Ambulation/Gait Ambulation/Gait assistance: Min guard Gait Distance (Feet): 150 Feet Assistive  device: IV Pole Gait Pattern/deviations: Step-through pattern;Decreased stride length Gait velocity: Decreased   General Gait Details: Slow, very cautious gait. IV pole used for support. No LOB noted. Pt reporting nausea which limited mobility. Educated about walking program to perform at home.   Stairs            Wheelchair Mobility    Modified Rankin (Stroke Patients Only)       Balance Overall balance assessment: Needs assistance Sitting-balance support: No upper extremity supported;Feet supported Sitting balance-Leahy Scale: Good     Standing balance support: Single extremity supported;No upper extremity supported;During functional activity Standing balance-Leahy Scale: Fair                               Pertinent Vitals/Pain Pain Assessment: Faces Faces Pain Scale: Hurts even more Pain Location: back Pain Descriptors / Indicators: Aching;Operative site guarding Pain Intervention(s): Limited activity within patient's tolerance;Monitored during session;Repositioned    Home Living Family/patient expects to be discharged to:: Private residence Living Arrangements: Spouse/significant other Available Help at Discharge: Family Type of Home: House Home Access: Stairs to enter Entrance Stairs-Rails: None Secretary/administrator of Steps: 2 Home Layout: One level Home Equipment: None      Prior Function Level of Independence: Independent               Hand Dominance        Extremity/Trunk Assessment   Upper Extremity Assessment Upper Extremity Assessment: Defer to OT evaluation    Lower Extremity Assessment Lower Extremity Assessment: Generalized weakness    Cervical / Trunk Assessment Cervical / Trunk Assessment: Other exceptions Cervical / Trunk Exceptions: s/p lumbar surgery  Communication   Communication:  No difficulties  Cognition Arousal/Alertness: Awake/alert Behavior During Therapy: WFL for tasks assessed/performed Overall  Cognitive Status: Within Functional Limits for tasks assessed                                        General Comments General comments (skin integrity, edema, etc.): Educated about performing car transfers while maintaining precautions     Exercises     Assessment/Plan    PT Assessment Patient needs continued PT services  PT Problem List Decreased strength;Decreased activity tolerance;Decreased balance;Decreased mobility;Decreased knowledge of precautions;Pain       PT Treatment Interventions DME instruction;Gait training;Stair training;Functional mobility training;Therapeutic activities;Therapeutic exercise;Balance training;Patient/family education    PT Goals (Current goals can be found in the Care Plan section)  Acute Rehab PT Goals Patient Stated Goal: to decrease pain PT Goal Formulation: With patient Time For Goal Achievement: 10/29/19 Potential to Achieve Goals: Good    Frequency Min 5X/week   Barriers to discharge        Co-evaluation               AM-PAC PT "6 Clicks" Mobility  Outcome Measure Help needed turning from your back to your side while in a flat bed without using bedrails?: A Little Help needed moving from lying on your back to sitting on the side of a flat bed without using bedrails?: A Little Help needed moving to and from a bed to a chair (including a wheelchair)?: A Little Help needed standing up from a chair using your arms (e.g., wheelchair or bedside chair)?: A Little Help needed to walk in hospital room?: A Little Help needed climbing 3-5 steps with a railing? : A Little 6 Click Score: 18    End of Session Equipment Utilized During Treatment: Gait belt;Back brace Activity Tolerance: Treatment limited secondary to medical complications (Comment)(nausea) Patient left: in bed;with call bell/phone within reach Nurse Communication: Mobility status PT Visit Diagnosis: Other abnormalities of gait and mobility  (R26.89);Pain Pain - part of body: (back)    Time: 1207-1225 PT Time Calculation (min) (ACUTE ONLY): 18 min   Charges:   PT Evaluation $PT Eval Low Complexity: [place], PT, DPT  Acute Rehabilitation Services  Pager: [phone] Office: [phone]   Rudean Hitt 10/15/2019, 1:34 PM

## 2019-10-15 NOTE — OR Nursing (Signed)
Per protocol Dr. Jetta Lout called at 09:39 am on 10/15/2019 to confirm probes were between L4-L5.

## 2019-10-15 NOTE — Anesthesia Postprocedure Evaluation (Signed)
Anesthesia Post Note  Patient: PIER LAUX  Procedure(s) Performed: Lumbar four through five Decompression insitu fusion (N/A Spine Lumbar)     Patient location during evaluation: PACU Anesthesia Type: General Level of consciousness: awake and alert Pain management: pain level controlled Vital Signs Assessment: post-procedure vital signs reviewed and stable Respiratory status: spontaneous breathing, nonlabored ventilation, respiratory function stable and patient connected to nasal cannula oxygen Cardiovascular status: blood pressure returned to baseline and stable Postop Assessment: no apparent nausea or vomiting Anesthetic complications: no    Last Vitals:  Vitals:   10/15/19 1045 10/15/19 1115  BP: 99/75 (!) 110/53  Pulse: 67 67  Resp: 17 17  Temp:    SpO2: 99% 100%    Last Pain:  Vitals:   10/15/19 1115  TempSrc:   PainSc: [place]

## 2019-10-15 NOTE — H&P (Signed)
Addendum H&P: No change in the patient's clinical exam since her last office visit of 10/06/2019  She continues to have significant back buttock and neuropathic leg pain right worse than the left.  Imaging studies confirmSpinal stenosisSlight degenerative scoliosis/spondylolisthesis along with lumbar stenosis causing nerve compression.  Surgical plan is a right L4-5 lumbar decompression to address the neuropathic leg pain followed by in situ fusion to decrease the potential risk of progressive instability.  All appropriate risks benefits and alternatives to surgery were discussed with the patient and she is has expressed understanding.  Furthermore, she expressed a desire to move forward with the surgery as planned.

## 2019-10-15 NOTE — Op Note (Signed)
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  Postoperative diagnosis: Same  Operative procedure: 1.  Right-sided Gill decompression with partial facetectomy and lateral recess decompression.   2.  In situ fusion L4-5  Graft: Autograft from local decompression mixed with DBX mix  First Assistant: Glynis Smiles, PA  Complications: None  Indications: Andrea Ewing is a very pleasant 73 year old woman who presented with severe back buttock and radicular right leg pain.  Attempts at conservative management failed to alleviate her symptoms and her quality of life continued to deteriorate.  Imaging studies confirmed lateral recess stenosis with L5 nerve compression.  Because of the underlying instability pattern we elected to move forward with lumbar decompression but to supplement this with an in situ fusion to diminish potential risk of worsening of her spondylolisthesis.  The patient has expressed understanding of the surgical procedure and has consented this point.  Operative report: Patient is brought the operating room placed upon the operating room table.  After successful induction of general anesthesia and endotracheal the patient teds SCDs and a Foley were inserted patient was turned prone onto the Wilson frame and all bony prominences were well-padded.  The back was then prepped and draped in a standard fashion.  Timeout was taken to confirm patient procedure and all other important data.  2 needles were placed in the back and an x-ray was taken to confirm that I was at the appropriate level.  The incision site was marked out and infiltrated with quarter percent Marcaine with epinephrine.  Midline incision was made and sharp dissection was carried out down to the deep fascia.  Deep fascia was sharply incised and I expose the L4 and L5 spinous process and lamina.  I then stripped the paraspinal muscles to expose  out over the L4-5 and L3-4 facet complexes.  Care was taken not to violate the facet capsule at L3-4.  I dissected over the lateral aspect until I could palpate and visualize the L4 transverse process.  I then remove the facet capsule at L4-5 and expose the L5 transverse process.  This was done bilaterally.  I then placed a Penfield 4 into the L4-5 facet complex and then took a second intraoperative localizing x-ray.  Having confirmed that was at the appropriate level I began my decompression.  The L4 and L5 spinous process were debrided with double-action Leksell rongeurs and then I remove the very thickened ligamentum flavum.  Using a Penfield 4 I developed a plane underneath the ligamentum flavum.  A neuro patty was used to protect the thecal sac and I used my 2 and 3 mm Kerrison rongeurs to perform a generous laminotomy of L4 on the right side.  I then continued into the lateral recess.  There was significant stenosis in the lateral recess consistent with what was seen on the preoperative MRI.  Once I was able to develop a plane and mobilized the thecal sac and L5 nerve root medially I was able to resect the medial portion of the facet complex of L4 and L5.  I took my lateral recess decompression out laterally until I could visualize the L5 pedicle.  The epidural veins were identified and coagulated with bipolar electrocautery.  I then proceeded inferiorly resecting the leading edge of the L5 lamina to adequately decompress the area.  This allowed me to get into the L5 foramen and perform a medial foraminotomy.  At this point I could freely mobilize the L5 nerve  root from its origin just proximal to the disc space over the disc space into the L5 foramen.  The lateral recess was completely decompressed from the L4 to the L5 pedicle.  I placed 2 surgical instruments into the field spanning my decompression level and took a final x-ray to confirm I had adequately decompressed the area of maximum stenosis seen on  the preoperative MRI.  Hemostasis was then obtained using bipolar electrocautery and the exposed thecal sac was protected with a sponge.  I then exposed the posterior lateral gutter and decorticated it with a high-speed bur.  The local bone graft that had been harvested during the decompression was then placed in the posterior lateral gutter and I augmented it with DBX mix allograft.  After final irrigation I confirmed that hemostasis and then checked one last time to confirm satisfactory decompression in the lateral recess into the L5 foramen superiorly to the L4 pedicle and medially.  Once I was satisfied with the overall decompression I did place approximately 20 mg (1/2 cc) of Depo-Medrol for postoperative analgesia.  A thrombin-soaked Gelfoam patty was then placed over the entire laminotomy defect and then I closed the deep fascia with interrupted #1 Vicryl sutures.  Layered closure was done with a 2-0 Vicryl suture and 3-0 Monocryl for the skin.  Steri-Strips and dry dressings were applied and the patient was ultimately extubated transfer the PACU without incident.  The end of the case all needle sponge counts were correct.  There were no adverse intraoperative events.

## 2019-10-15 NOTE — Discharge Instructions (Signed)
Surgical Spinal Decompression, Care After Refer to this sheet in the next few weeks. These instructions provide you with information about caring for yourself after your procedure. Your health care provider may also give you more specific instructions. Your treatment has been planned according to current medical practices, but problems sometimes occur. Call your health care provider if you have any problems or questions after your procedure. What can I expect after the procedure? It is common to have pain for the first few days after the procedure. Some people continue to have mild pain even after making a full recovery. Follow these instructions at home: Medicine  Take medicines only as directed by your health care provider.  Avoid taking over-the-counter pain medicines unless your health care provider tells you otherwise. These medicines interfere with the development and growth of new bone cells.  If you were prescribed a narcotic pain medicine, take it exactly as told by your health care provider. ? Do not drink alcohol while on the medicine. ? Do not drive while on the medicine. Injury care  Care for your back brace as told by your health care provider.  If directed, apply ice to the injured area: ? Put ice in a plastic bag. ? Place a towel between your skin and the bag. ? Leave the ice on for 20 minutes, 2-3 times a day. Activity  Perform physical therapy exercises as told by your health care provider.  Exercise regularly. Start by taking short walks. Slowly increase your activity level over time. Gentle exercise helps to ease pain.  Sit, stand, walk, turn in bed, and reposition yourself as told by your health care provider. This will help to keep your spine in proper alignment.  Avoid bending and twisting your body.  Avoid doing strenuous household chores, such as vacuuming.  Do not lift anything that is heavier than 10 lb (4.5 kg). Other Instructions  Keep all follow-up  visits as directed by your health care provider. This is important.  Do not use any tobacco products, including cigarettes, chewing tobacco, or electronic cigarettes. If you need help quitting, ask your health care provider. Nicotine affects the way bones heal. Contact a health care provider if:  Your pain gets worse.  You have a fever.  You have redness, swelling, or pain at the site of your incision.  You have fluid, blood, or pus coming from your incision.  You have numbness, tingling, or weakness in any part of your body. Get help right away if:  Your incision feels swollen and tender, and the surrounding area looks like a lump. The lump may be red or bluish in color.  You cannot move any part of your body (paralysis).  You cannot control your bladder or bowels. This information is not intended to replace advice given to you by your health care provider. Make sure you discuss any questions you have with your health care provider.   OK TO RESTART ASPIRIN ON Saturday  10/17/19

## 2019-10-16 ENCOUNTER — Encounter (HOSPITAL_COMMUNITY): Payer: Self-pay | Admitting: Orthopedic Surgery

## 2019-10-16 DIAGNOSIS — M4316 Spondylolisthesis, lumbar region: Secondary | ICD-10-CM | POA: Diagnosis not present

## 2019-10-16 NOTE — Evaluation (Signed)
Occupational Therapy Evaluation and Discharge Patient Details Name: Andrea Ewing MRN: [id number] DOB: 18-Feb-1946 Today's Date: 10/16/2019    History of Present Illness Pt is a 73 y/o female s/p L4-5 decompression. PMH includes HTN, glaucoma, CAD, and osteoporosis.    Clinical Impression   This 73 yo female admitted and underwent above presents to acute OT with all education completed, we will D/C from acute OT.    Follow Up Recommendations  No OT follow up;Supervision - Intermittent    Equipment Recommendations  None recommended by OT       Precautions / Restrictions Precautions Precautions: Back Precaution Booklet Issued: Yes (comment) Precaution Comments: reviewed back precautions with pt.  Required Braces or Orthoses: Spinal Brace Spinal Brace: Lumbar corset;Applied in sitting position Restrictions Weight Bearing Restrictions: No      Mobility Bed Mobility Overal bed mobility: Modified Independent Bed Mobility: Rolling;Sidelying to Sit Rolling: Modified independent (Device/Increase time) Sidelying to sit: Modified independent (Device/Increase time)          Transfers Overall transfer level: Needs assistance Equipment used: None Transfers: Sit to/from Stand Sit to Stand: Modified independent (Device/Increase time)              Balance Overall balance assessment: Mild deficits observed, not formally tested(when up on feet)                                         ADL either performed or assessed with clinical judgement   ADL                                         General ADL Comments: Pt donned her LB clothing by crossing her legs to get to her feet, pt donned her brace independently, I showed her the stance for easier sit<>stand and she returned demonstrated, we went over the placement of pillows when in bed, how to use 2 cups for brushing teeth to avoid bending over the sink, use of wet wipes for back peri care,  limit of sitting to 20-30 minutes at a time to avoid getting stiff and sore, and side stepping into tub.     Vision Patient Visual Report: No change from baseline              Pertinent Vitals/Pain Pain Assessment: 0-10 Pain Score: 4  Pain Location: incisional site Pain Descriptors / Indicators: Aching;Operative site guarding Pain Intervention(s): Limited activity within patient's tolerance;Monitored during session;Repositioned     Hand Dominance Right   Extremity/Trunk Assessment Upper Extremity Assessment Upper Extremity Assessment: Overall WFL for tasks assessed           Communication Communication Communication: No difficulties   Cognition Arousal/Alertness: Awake/alert Behavior During Therapy: WFL for tasks assessed/performed Overall Cognitive Status: Within Functional Limits for tasks assessed                                                Home Living Family/patient expects to be discharged to:: Private residence Living Arrangements: Spouse/significant other Available Help at Discharge: Family Type of Home: House Home Access: Stairs to enter Technical brewer of Steps: 2 Entrance Stairs-Rails: None Home Layout: One level  Bathroom Shower/Tub: IT trainer: Standard     Home Equipment: Shower seat;Grab bars - tub/shower   Additional Comments: Has something on either side of toilet to hold onto as well      Prior Functioning/Environment Level of Independence: Independent                 OT Problem List: Decreased range of motion;Impaired balance (sitting and/or standing);Pain         OT Goals(Current goals can be found in the care plan section) Acute Rehab OT Goals Patient Stated Goal: to go home today  OT Frequency:                AM-PAC OT "6 Clicks" Daily Activity     Outcome Measure Help from another person eating meals?: None Help from another person taking care of  personal grooming?: None Help from another person toileting, which includes using toliet, bedpan, or urinal?: None Help from another person bathing (including washing, rinsing, drying)?: None Help from another person to put on and taking off regular upper body clothing?: None Help from another person to put on and taking off regular lower body clothing?: None 6 Click Score: 24   End of Session Nurse Communication: (no follow up OT needs)  Activity Tolerance: Patient tolerated treatment well Patient left: in chair;with call bell/phone within reach  OT Visit Diagnosis: Unsteadiness on feet (R26.81);Pain Pain - part of body: (back)                Time: 3329-5188 OT Time Calculation (min): 18 min Charges:  OT General Charges $OT Visit: 1 Visit OT Evaluation $OT Eval Moderate Complexity: 1 Mod  Ignacia Palma, OTR/L Acute [employer] Pager [phone] Office [phone]     Evette Georges 10/16/2019, 9:13 AM

## 2019-10-16 NOTE — Plan of Care (Signed)
Pt doing well. Pt given D/C instructions with verbal understanding. Rx's were sent to pharmacy by MD. Pt's incision is clean and dry with no sign of infection. Pt's IV was removed prior to D/C. Pt D/C'd home via wheelchair per MD order. Pt is stable @ D/C and has no other needs at this time. Verta Riedlinger, RN  

## 2019-10-16 NOTE — Progress Notes (Signed)
Physical Therapy Treatment Patient Details Name: Andrea Ewing MRN: [id number] DOB: [birth year] Today's Date: 10/16/2019    History of Present Illness Pt is a 73 y/o female s/p L4-5 decompression. PMH includes HTN, glaucoma, CAD, and osteoporosis.     PT Comments    Pt progressing well with post-op mobility. She was able to demonstrate transfers and ambulation with gross modified independence. Pt was educated on precautions, brace application/wearing schedule, appropriate activity progression, and car transfer. She has met acute PT goals and will be d/c from PT services at this time. If needs change, please reconsult.     Follow Up Recommendations  No PT follow up     Equipment Recommendations  None recommended by PT    Recommendations for Other Services       Precautions / Restrictions Precautions Precautions: Back Precaution Booklet Issued: Yes (comment) Precaution Comments: reviewed back precautions with pt.  Required Braces or Orthoses: Spinal Brace Spinal Brace: Lumbar corset;Applied in sitting position Restrictions Weight Bearing Restrictions: No    Mobility  Bed Mobility Overal bed mobility: Modified Independent Bed Mobility: Rolling;Sidelying to Sit Rolling: Modified independent (Device/Increase time) Sidelying to sit: Modified independent (Device/Increase time)       General bed mobility comments: Pt was received sitting up in recliner.   Transfers Overall transfer level: Modified independent Equipment used: None Transfers: Sit to/from Stand Sit to Stand: Modified independent (Device/Increase time)         General transfer comment: Increased time but no assist required.   Ambulation/Gait Ambulation/Gait assistance: Modified Independence Gait Distance (Feet): 300 Feet Assistive device: None Gait Pattern/deviations: Trunk flexed;Drifts right/left Gait velocity: WFL Gait velocity interpretation: >2.62 ft/sec, indicative of community  ambulatory General Gait Details: Pt ambulating quickly and without overt LOB. Noted some drifting at times but otherwise ambulating well.    Stairs Stairs: Yes Stairs assistance: Min guard Stair Management: One rail Right;Alternating pattern;Forwards Number of Stairs: 4 General stair comments: VC's for sequencing and general safety. Hands-on guarding for safety but no assist required.    Wheelchair Mobility    Modified Rankin (Stroke Patients Only)       Balance Overall balance assessment: Mild deficits observed, not formally tested Sitting-balance support: No upper extremity supported;Feet supported Sitting balance-Leahy Scale: Good     Standing balance support: Single extremity supported;No upper extremity supported;During functional activity Standing balance-Leahy Scale: Fair                              Cognition Arousal/Alertness: Awake/alert Behavior During Therapy: WFL for tasks assessed/performed Overall Cognitive Status: Within Functional Limits for tasks assessed                                        Exercises      General Comments        Pertinent Vitals/Pain Pain Assessment: Faces Pain Score: 4  Faces Pain Scale: Hurts little more Pain Location: incisional site Pain Descriptors / Indicators: Operative site guarding;Grimacing Pain Intervention(s): Limited activity within patient's tolerance;Monitored during session;Repositioned    Home Living Family/patient expects to be discharged to:: Private residence Living Arrangements: Spouse/significant other Available Help at Discharge: Family Type of Home: House Home Access: Stairs to enter Entrance Stairs-Rails: None Home Layout: One level Home Equipment: Shower seat;Grab bars - tub/shower Additional Comments: Has something on either side of toilet to hold  onto as well    Prior Function Level of Independence: Independent          PT Goals (current goals can now be  found in the care plan section) Acute Rehab PT Goals Patient Stated Goal: to go home today PT Goal Formulation: With patient Time For Goal Achievement: 10/29/19 Potential to Achieve Goals: Good Progress towards PT goals: Progressing toward goals    Frequency    Min 5X/week      PT Plan Current plan remains appropriate    Co-evaluation              AM-PAC PT "6 Clicks" Mobility   Outcome Measure  Help needed turning from your back to your side while in a flat bed without using bedrails?: A Little Help needed moving from lying on your back to sitting on the side of a flat bed without using bedrails?: A Little Help needed moving to and from a bed to a chair (including a wheelchair)?: A Little Help needed standing up from a chair using your arms (e.g., wheelchair or bedside chair)?: A Little Help needed to walk in hospital room?: A Little Help needed climbing 3-5 steps with a railing? : A Little 6 Click Score: 18    End of Session Equipment Utilized During Treatment: Gait belt;Back brace Activity Tolerance: Treatment limited secondary to medical complications (Comment)(nausea) Patient left: in bed;with call bell/phone within reach Nurse Communication: Mobility status PT Visit Diagnosis: Other abnormalities of gait and mobility (R26.89);Pain Pain - part of body: (back)     Time: 1829-9371 PT Time Calculation (min) (ACUTE ONLY): 12 min  Charges:  $Gait Training: 8-22 mins                     Rolinda Roan, PT, DPT Acute Rehabilitation Services Pager: [phone] Office: [phone]    Thelma Comp 10/16/2019, 1:06 PM

## 2019-10-16 NOTE — Progress Notes (Signed)
    Subjective: Procedure(s) (LRB): Lumbar four through five Decompression insitu fusion (N/A) 1 Day Post-Op  Patient reports pain as 2 on 0-10 scale.  Reports decreased leg pain reports incisional back pain   Positive void Negative bowel movement Negative flatus Negative chest pain or shortness of breath  Objective: Vital signs in last 24 hours: Temp:  [97.6 F (36.4 C)-98.7 F (37.1 C)] 97.7 F (36.5 C) (10/16 0415) Pulse Rate:  [67-91] 83 (10/16 0415) Resp:  [14-25] 18 (10/16 0415) BP: (99-136)/(53-75) 136/68 (10/16 0415) SpO2:  [96 %-100 %] 100 % (10/16 0415)  Intake/Output from previous day: 10/15 0701 - 10/16 0700 In: 600 [I.V.:600] Out: 150 [Urine:100; Blood:50]  Labs: Recent Labs    10/15/19 0647  HCT 40.0   Recent Labs    10/15/19 0647  NA 137  K 2.8*  CL 98  BUN 16  CREATININE 0.90  GLUCOSE 109*   No results for input(s): LABPT, INR in the last 72 hours.  Physical Exam: Neurologically intact Intact pulses distally Dorsiflexion/Plantar flexion intact Incision: dressing C/D/I and no drainage Compartment soft Body mass index is 30.84 kg/m.   Assessment/Plan: Patient stable  xrays n/a Continue mobilization with physical therapy Continue care  Advance diet Up with therapy  1.  Overall patient doing exceptionally well.  Radicular right leg pain has improved. 2.  She has been ambulating with therapy in the hallway and is reported primarily incisional back pain. 3.  Encouraged ambulation this morning.  Plan on discharge to home after she has flatus/bowel movement. 4.  Follow-up with me in 2 weeks for wound reevaluation.  Melina Schools, MD [facility] [phone]

## 2019-10-26 NOTE — Discharge Summary (Signed)
Patient ID: Andrea Ewing MRN: [id number] DOB/AGE: 1946/01/27 73 y.o.  Admit date: 10/15/2019 Discharge date: 10/26/2019  Admission Diagnoses:  Active Problems:   Spinal stenosis at L4-L5 level   Discharge Diagnoses:  Active Problems:   Spinal stenosis at L4-L5 level  status post Procedure(s): Lumbar four through five Decompression insitu fusion  Past Medical History:  Diagnosis Date  . Abnormal myocardial perfusion study 01/09/2016   Overview:  4.6 Mets, EKG normal. inferobasilar ischemia, EF 74%  . 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 Arthritis   . Carotid atherosclerosis 02/06/2018  . Chest pain in adult 01/09/2016  . Complication of anesthesia    Pt states BP drops after surgery, in recovery  . Essential hypertension 02/06/2018  . GERD (gastroesophageal reflux disease) 02/06/2018  . Glaucoma 02/06/2018  . Hyperlipidemia 02/06/2018  . Hypothyroidism   . Migraine 02/06/2018  . Mild CAD 01/30/2016   Overview:  Cardiac cath 01/17/16:Conclusions Diagnostic Procedure Summary Mild non obstructive disease in the LAD and LCX  . Multinodular goiter 02/06/2018  . Osteoporosis 02/06/2018    Surgeries: Procedure(s): Lumbar four through five Decompression insitu fusion on 10/15/2019   Consultants:   Discharged Condition: Improved  Hospital Course: Andrea Ewing is an 73 y.o. female who was admitted 10/15/2019 for operative treatment of L4-5 spinal stenosis with right L5 nerve compression. Patient failed conservative treatments (please see the history and physical for the specifics) and had severe unremitting pain that affects sleep, daily activities and work/hobbies. After pre-op clearance, the patient was taken to the operating room on 10/15/2019 and underwent  Procedure(s): Lumbar four through five Decompression insitu fusion.    Patient was given perioperative antibiotics:  Anti-infectives (From admission, onward)   Start     Dose/Rate Route Frequency Ordered Stop   10/15/19 1600  ceFAZolin  (ANCEF) IVPB 1 g/50 mL premix     1 g 100 mL/hr over 30 Minutes Intravenous Every 8 hours 10/15/19 1132 10/15/19 2348   10/15/19 0559  ceFAZolin (ANCEF) 2-4 GM/100ML-% IVPB    Note to Pharmacy: Janene HarveyMurray, Karol   : cabinet override      10/15/19 0559 10/15/19 0740   10/15/19 0556  ceFAZolin (ANCEF) IVPB 2g/100 mL premix     2 g 200 mL/hr over 30 Minutes Intravenous 30 min pre-op 10/15/19 0556 10/15/19 0740       Patient was given sequential compression devices and early ambulation to prevent DVT.   Patient benefited maximally from hospital stay and there were no complications. At the time of discharge, the patient was urinating/moving their bowels without difficulty, tolerating a regular diet, pain is controlled with oral pain medications and they have been cleared by PT/OT.   Recent vital signs: No data found.   Recent laboratory studies: No results for input(s): WBC, HGB, HCT, PLT, NA, K, CL, CO2, BUN, CREATININE, GLUCOSE, INR, CALCIUM in the last 72 hours.  Invalid input(s): PT, 2   Discharge Medications:   Allergies as of 10/16/2019      Reactions   Gabapentin Other (See Comments)   "makes me crazy"      Medication List    STOP taking these medications   acetaminophen-codeine 300-30 MG tablet Commonly known as: TYLENOL #3   meloxicam 15 MG tablet Commonly known as: MOBIC     TAKE these medications   amLODipine-benazepril 5-10 MG capsule Commonly known as: LOTREL Take 1 capsule by mouth daily.   aspirin EC 81 MG tablet Take 81 mg by mouth daily.  atorvastatin 20 MG tablet Commonly known as: LIPITOR Take 20 mg by mouth daily after supper.   bimatoprost 0.01 % Soln Commonly known as: LUMIGAN Place 1 drop into both eyes at bedtime.   Calcium 1000 + D 1000-800 MG-UNIT Tabs Generic drug: Calcium Carb-Cholecalciferol Take 1 tablet by mouth daily.   Creon 36000 UNITS Cpep capsule Generic drug: lipase/protease/amylase Take 36,000 Units by mouth 3 (three) times  daily with meals.   esomeprazole 40 MG capsule Commonly known as: NEXIUM Take 40 mg by mouth daily.   famotidine 40 MG tablet Commonly known as: PEPCID Take 40 mg by mouth at bedtime.   hydrochlorothiazide 25 MG tablet Commonly known as: HYDRODIURIL Take 25 mg by mouth daily.   metoprolol tartrate 25 MG tablet Commonly known as: LOPRESSOR Take 1 tablet (25 mg total) by mouth 2 (two) times daily.   multivitamin tablet Take 1 tablet by mouth daily.   nitroGLYCERIN 0.4 MG SL tablet Commonly known as: NITROSTAT Place 0.4 mg under the tongue every 5 (five) minutes as needed for chest pain.   ondansetron 4 MG tablet Commonly known as: Zofran Take 1 tablet (4 mg total) by mouth every 8 (eight) hours as needed for nausea or vomiting.   polyethylene glycol 17 g packet Commonly known as: MIRALAX / GLYCOLAX Take 17 g by mouth daily as needed for moderate constipation.   Prolia 60 MG/ML Sosy injection Generic drug: denosumab Inject 60 mg into the skin every 6 (six) months.     ASK your doctor about these medications   HYDROcodone-acetaminophen 5-325 MG tablet Commonly known as: Norco Take 1 tablet by mouth every 6 (six) hours as needed for up to 5 days. Ask about: Should I take this medication?   methocarbamol 500 MG tablet Commonly known as: Robaxin Take 1 tablet (500 mg total) by mouth every 8 (eight) hours as needed for up to 5 days for muscle spasms. Ask about: Should I take this medication?       Diagnostic Studies: Dg Chest 2 View  Result Date: 10/12/2019 CLINICAL DATA:  73 year old female under preoperative evaluation prior to spinal fusion. EXAM: CHEST - 2 VIEW COMPARISON:  Chest x-ray 09/08/2019. FINDINGS: Lung volumes are normal. Linear scarring in the periphery of the left base, similar to prior examinations. No consolidative airspace disease. No pleural effusions. No pneumothorax. No pulmonary nodule or mass noted. Pulmonary vasculature and the cardiomediastinal  silhouette are within normal limits. IMPRESSION: No radiographic evidence of acute cardiopulmonary disease. Electronically Signed   By: Vinnie Langton M.D.   On: 10/12/2019 16:49   Dg Lumbar Spine 1 View  Result Date: 10/15/2019 CLINICAL DATA:  L4-5 fusion EXAM: LUMBAR SPINE - 1 VIEW COMPARISON:  None. FINDINGS: Posterior needles intraoperative imaging demonstrates overlying the L4 and L5 spinous processes. IMPRESSION: Intraoperative localization as above. Electronically Signed   By: Rolm Baptise M.D.   On: 10/15/2019 10:44   Dg Spine Portable 1 View  Result Date: 10/15/2019 CLINICAL DATA:  Intraoperative radiograph. EXAM: PORTABLE SPINE - 1 VIEW COMPARISON:  Earlier the same date. FINDINGS: Single lateral intraoperative radiograph of the lumbosacral spine demonstrate placement of second surgical probe. L4-L5 disc space is located between the 2 surgical probes. Retractors at the level of L5 posterior process. IMPRESSION: Intraoperative radiograph of the lumbosacral spine demonstrating placement of second surgical probe. L4-L5 disc space is located between the 2 surgical probes. Electronically Signed   By: Fidela Salisbury M.D.   On: 10/15/2019 09:32   Dg Spine  Portable 1 View  Result Date: 10/15/2019 CLINICAL DATA:  Elective lumbar surgery. EXAM: PORTABLE SPINE - 1 VIEW COMPARISON:  MRI of May 07, 2019. FINDINGS: Single intraoperative cross-table lateral projection was obtained of the lumbar spine. Surgical probe is seen overlying the posterior elements of L5. IMPRESSION: Surgical localization as described above. These results were called by telephone at the time of interpretation on 10/15/2019 at 8:31 am to provider [facility] , who verbally acknowledged these results. Electronically Signed   By: Lupita Raider M.D.   On: 10/15/2019 08:32    Discharge Instructions    Incentive spirometry RT   Complete by: As directed       Follow-up Information    Venita Lick, MD. Schedule an  appointment as soon as possible for a visit in 2 weeks.   Specialty: Orthopedic Surgery Why: If symptoms worsen, For suture removal, For wound re-check Contact information: [address] [place] [address] [phone]           Discharge Plan:  discharge to home  Disposition: stable    Signed: Leonette Monarch Ward for [facility] PA-C [facility] [phone] 10/26/2019, 8:17 AM

## 2020-01-24 ENCOUNTER — Other Ambulatory Visit: Payer: Self-pay | Admitting: Cardiology

## 2020-02-04 IMAGING — CR DG CHEST 2V
2 series · 2 of 2 positions shown · non-contrast
Comparison: Chest x-ray 09/08/2019.

CLINICAL DATA: 72-year-old female under preoperative evaluation
prior to spinal fusion.

EXAM:
CHEST - 2 VIEW

[w chest pa]
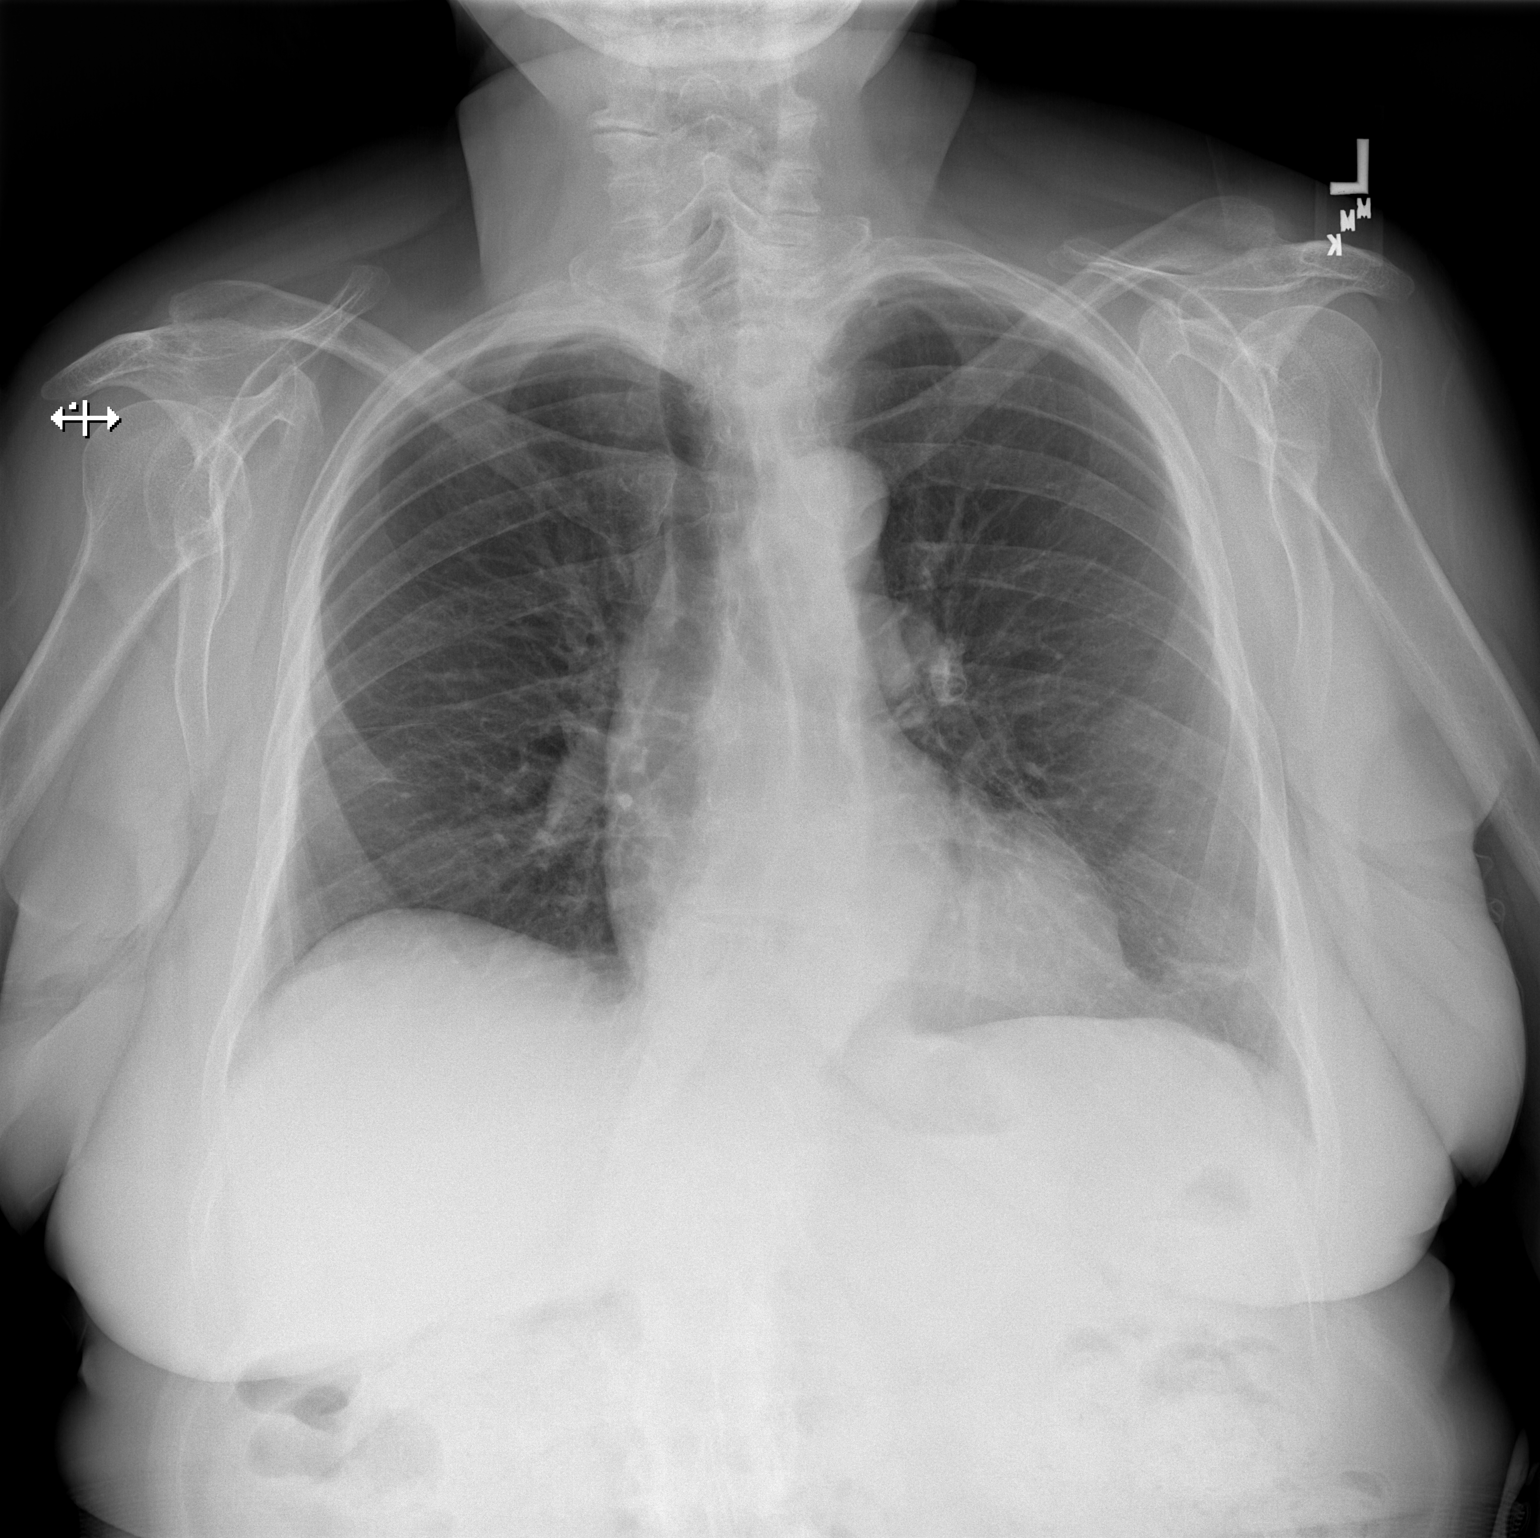

[w chest lat]
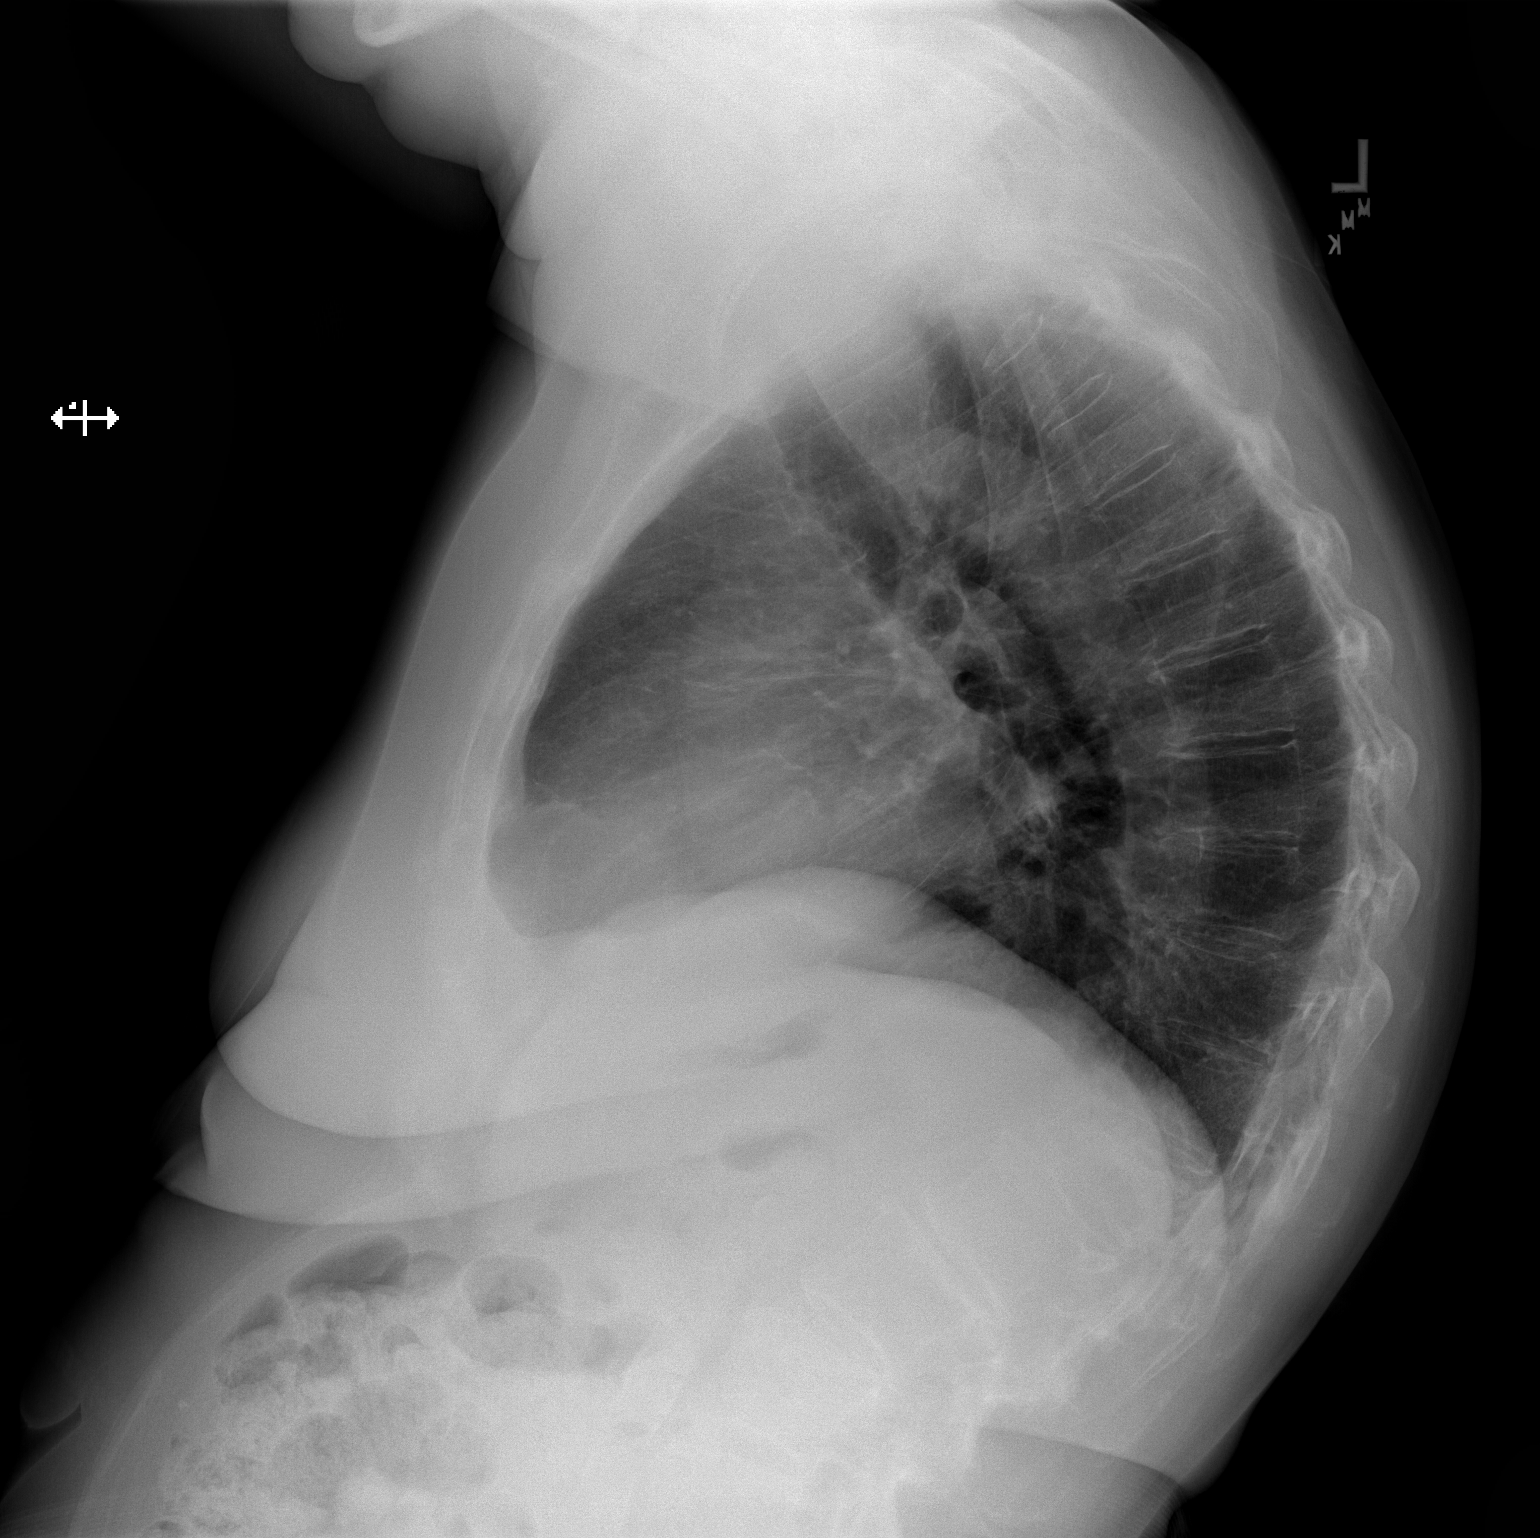

[2 of 2 positions shown; findings below may reference images not displayed]

FINDINGS: Lung volumes are normal. Linear scarring in the periphery of the
left base, similar to prior examinations. No consolidative airspace
disease. No pleural effusions. No pneumothorax. No pulmonary nodule
or mass noted. Pulmonary vasculature and the cardiomediastinal
silhouette are within normal limits.
IMPRESSION: No radiographic evidence of acute cardiopulmonary disease.

## 2020-02-07 IMAGING — CR DG LUMBAR SPINE 1V
1 series · 1 of 1 positions shown · non-contrast
Comparison: None.

CLINICAL DATA: L4-5 fusion

EXAM:
LUMBAR SPINE - 1 VIEW

[lateral]
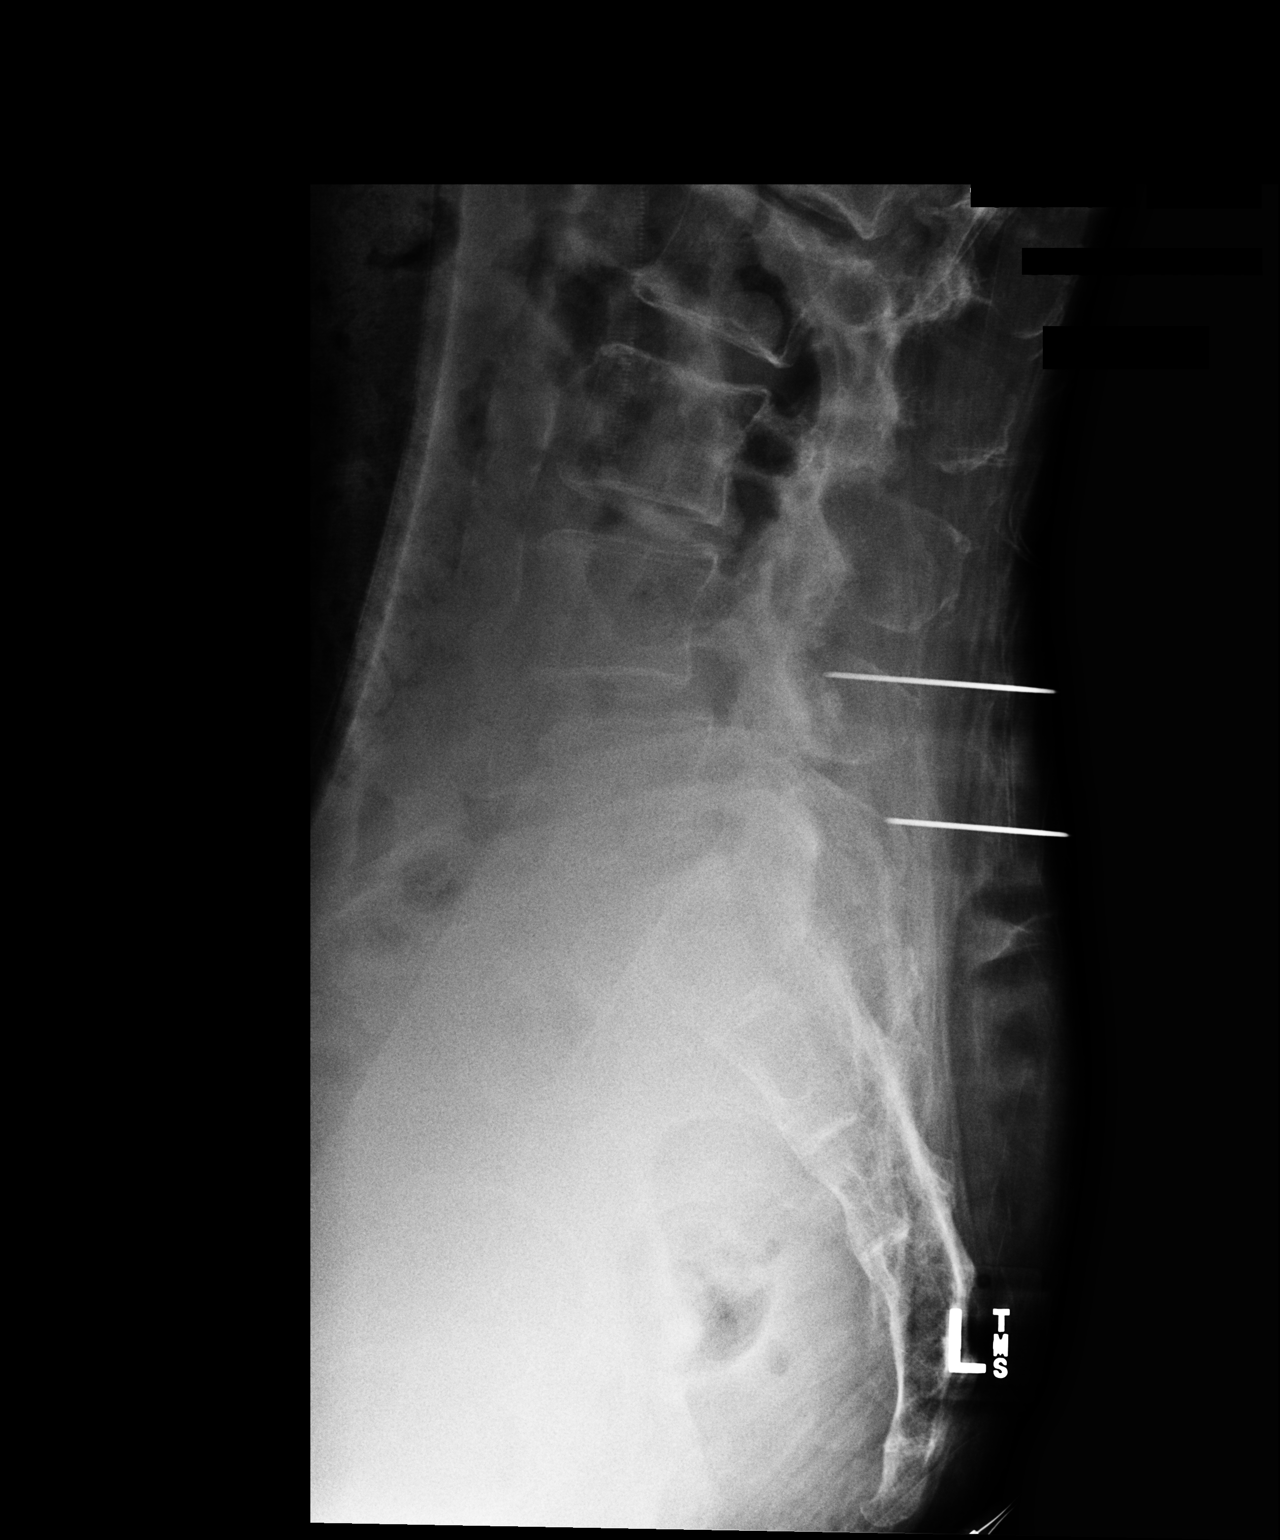

[1 of 1 positions shown; findings below may reference images not displayed]

FINDINGS: Posterior needles intraoperative imaging demonstrates overlying the
L4 and L5 spinous processes.
IMPRESSION: Intraoperative localization as above.

## 2020-02-07 IMAGING — CR DG SPINE 1V PORT
1 series · 1 of 1 positions shown · non-contrast
Comparison: Earlier the same date.

CLINICAL DATA: Intraoperative radiograph.

EXAM:
PORTABLE SPINE - 1 VIEW

[lateral]
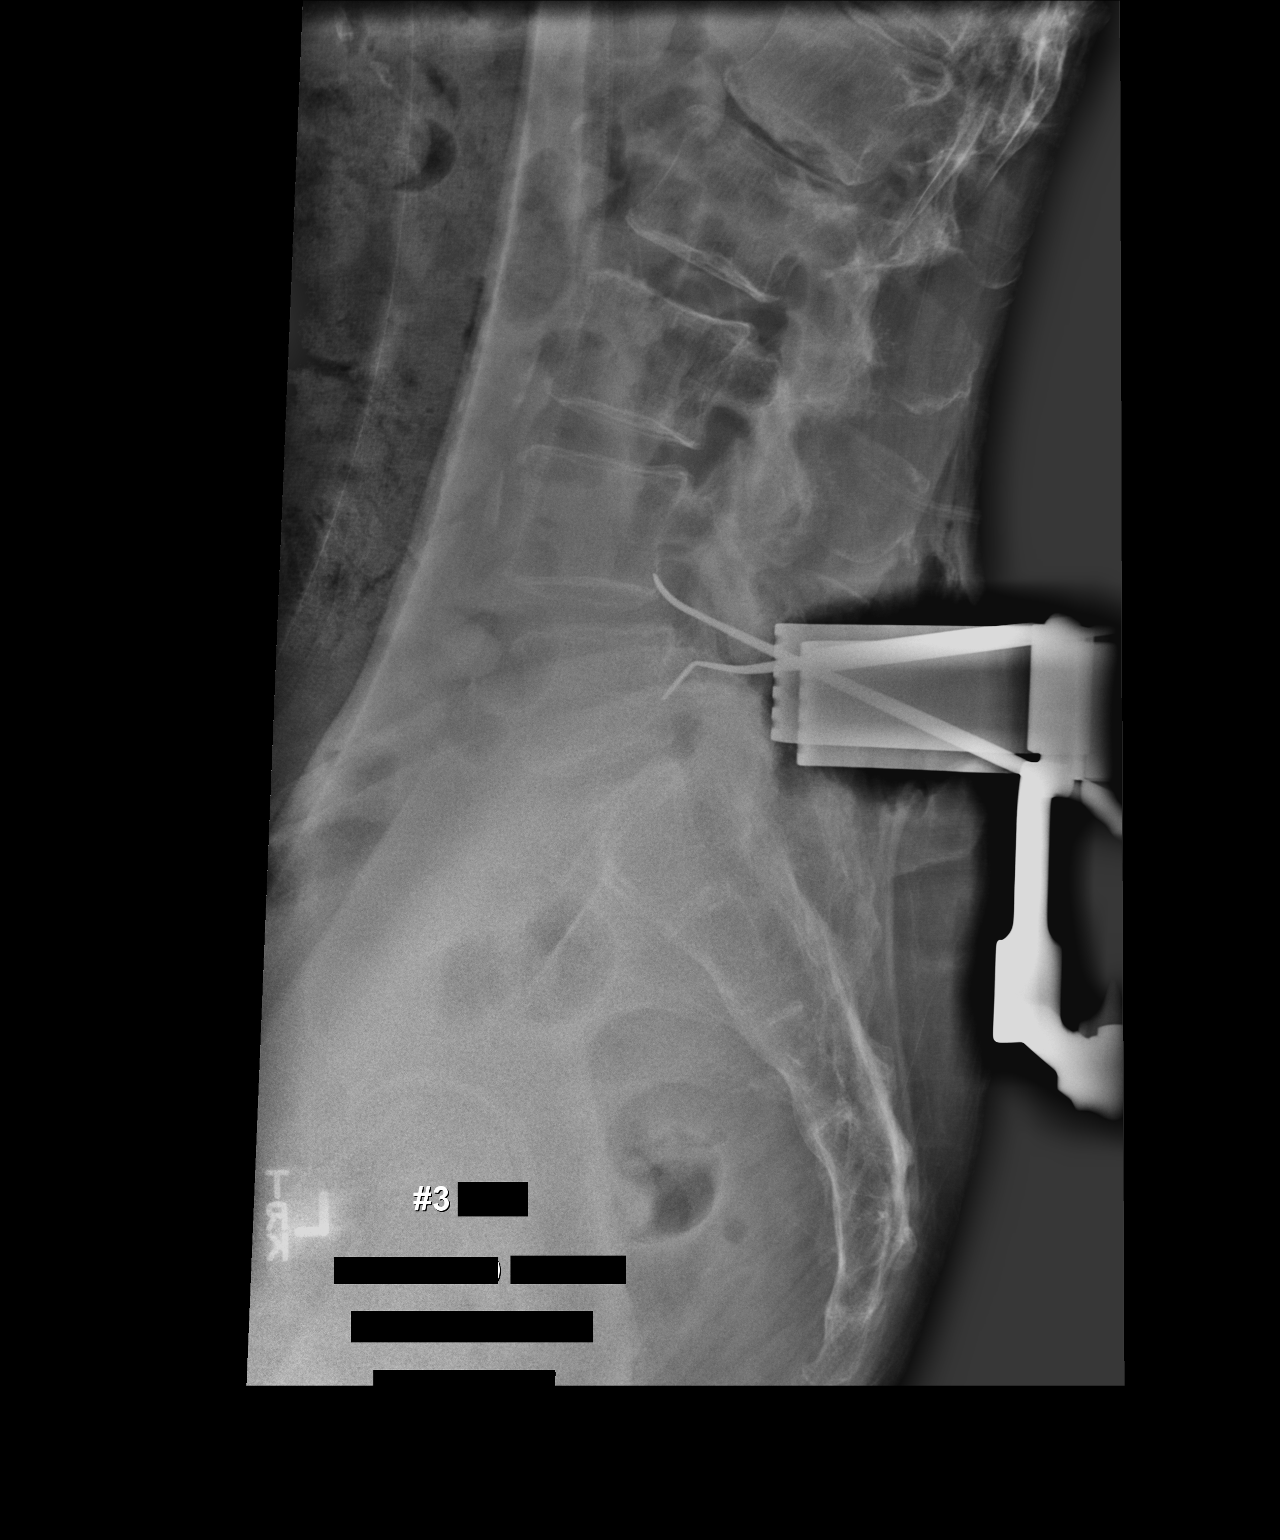

[1 of 1 positions shown; findings below may reference images not displayed]

FINDINGS: Single lateral intraoperative radiograph of the lumbosacral spine
demonstrate placement of second surgical probe. L4-L5 disc space is
located between the 2 surgical probes.

Retractors at the level of L5 posterior process.
IMPRESSION: Intraoperative radiograph of the lumbosacral spine demonstrating
placement of second surgical probe. L4-L5 disc space is located
between the 2 surgical probes.

## 2020-08-29 ENCOUNTER — Other Ambulatory Visit: Payer: Self-pay | Admitting: Cardiology

## 2020-11-08 ENCOUNTER — Ambulatory Visit: Payer: Medicare Other | Admitting: Cardiology

## 2020-11-08 DIAGNOSIS — T8859XA Other complications of anesthesia, initial encounter: Secondary | ICD-10-CM | POA: Insufficient documentation

## 2020-11-08 DIAGNOSIS — M1990 Unspecified osteoarthritis, unspecified site: Secondary | ICD-10-CM | POA: Insufficient documentation

## 2020-11-08 DIAGNOSIS — E039 Hypothyroidism, unspecified: Secondary | ICD-10-CM | POA: Insufficient documentation

## 2020-11-08 NOTE — Progress Notes (Deleted)
Cardiology Office Note:    Date:  11/08/2020   ID:  Andrea Ewing, DOB [birth year], MRN [id number]  PCP:  Ralene Ok, MD  Cardiologist:  Norman Herrlich, MD    Referring MD: Ralene Ok, MD    ASSESSMENT:    No diagnosis found. PLAN:    In order of problems listed above:  1. ***   Next appointment: ***   Medication Adjustments/Labs and Tests Ordered: Current medicines are reviewed at length with the patient today.  Concerns regarding medicines are outlined above.  No orders of the defined types were placed in this encounter.  No orders of the defined types were placed in this encounter.   No chief complaint on file.   History of Present Illness:    Andrea Ewing is a 74 y.o. 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 Compliance with diet, lifestyle and medications: ***  She had an EKG performed 09/06/2020 at [facility] ED showing sinus rhythm abnormal R wave progression.  Chest x-ray that day was read as normal. Past Medical History:  Diagnosis Date  . Abnormal myocardial perfusion study 01/09/2016   Overview:  4.6 Mets, EKG normal. inferobasilar ischemia, EF 74%  . 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 Arthritis   . Carotid atherosclerosis 02/06/2018  . Chest pain in adult 01/09/2016  . Complication of anesthesia    Pt states BP drops after surgery, in recovery  . Essential hypertension 02/06/2018  . GERD (gastroesophageal reflux disease) 02/06/2018  . Glaucoma 02/06/2018  . Hyperlipidemia 02/06/2018  . Hypothyroidism   . Migraine 02/06/2018  . Mild CAD 01/30/2016   Overview:  Cardiac cath 01/17/16:Conclusions Diagnostic Procedure Summary Mild non obstructive disease in the LAD and LCX  . Multinodular goiter 02/06/2018  . Osteoporosis 02/06/2018    Past Surgical History:  Procedure Laterality Date  . CARDIAC CATHETERIZATION  01/17/2016   . COLONOSCOPY    . LUMBAR LAMINECTOMY/DECOMPRESSION MICRODISCECTOMY N/A 10/15/2019   Procedure: Lumbar four through five Decompression insitu fusion;  Surgeon: Venita Lick, MD;  Location: [facility] OR;  Service: Orthopedics;  Laterality: N/A;  3.5 hrs  . TUBAL LIGATION    . WISDOM TOOTH EXTRACTION     only 2    Current Medications: No outpatient medications have been marked as taking for the 11/08/20 encounter (Appointment) with Baldo Daub, MD.     Allergies:   Gabapentin   Social History   Socioeconomic History  . Marital status: Married    Spouse name: Not on file  . Number of children: Not on file  . Years of education: Not on file  . Highest education level: Not on file  Occupational History  . Not on file  Tobacco Use  . Smoking status: Never Smoker  . Smokeless tobacco: Never Used  Vaping Use  . Vaping Use: Never used  Substance and Sexual Activity  . Alcohol use: No  . Drug use: No  . Sexual activity: Not on file    Comment: Tubal   Other Topics Concern  . Not on file  Social History Narrative  . Not on file   Social Determinants of Health   Financial Resource Strain:   . Difficulty of Paying Living Expenses: Not on file  Food Insecurity:   . Worried About Programme researcher, broadcasting/film/video in the Last Year: Not on file  . Ran Out of Food in the  Last Year: Not on file  Transportation Needs:   . Lack of Transportation (Medical): Not on file  . Lack of Transportation (Non-Medical): Not on file  Physical Activity:   . Days of Exercise per Week: Not on file  . Minutes of Exercise per Session: Not on file  Stress:   . Feeling of Stress : Not on file  Social Connections:   . Frequency of Communication with Friends and Family: Not on file  . Frequency of Social Gatherings with Friends and Family: Not on file  . Attends Religious Services: Not on file  . Active Member of Clubs or Organizations: Not on file  . Attends Banker Meetings: Not on file  . Marital  Status: Not on file     Family History: The patient's ***family history includes COPD in her mother; Diabetes in her brother, father, and mother; Heart disease in her maternal grandfather, maternal grandmother, mother, paternal grandmother, and paternal uncle. ROS:   Please see the history of present illness.    All other systems reviewed and are negative.  EKGs/Labs/Other Studies Reviewed:    The following studies were reviewed today:  EKG:  EKG ordered today and personally reviewed.  The ekg ordered today demonstrates ***  Recent Labs: No results found for requested labs within last 8760 hours.  Recent Lipid Panel No results found for: CHOL, TRIG, HDL, CHOLHDL, VLDL, LDLCALC, LDLDIRECT  Physical Exam:    VS:  LMP  (LMP Unknown) Comment: tubal ligation    Wt Readings from Last 3 Encounters:  10/15/19 175 lb 8 oz (79.6 kg)  10/12/19 175 lb 8 oz (79.6 kg)  09/30/19 175 lb 12.8 oz (79.7 kg)     GEN: *** Well nourished, well developed in no acute distress HEENT: Normal NECK: No JVD; No carotid bruits LYMPHATICS: No lymphadenopathy CARDIAC: ***RRR, no murmurs, rubs, gallops RESPIRATORY:  Clear to auscultation without rales, wheezing or rhonchi  ABDOMEN: Soft, non-tender, non-distended MUSCULOSKELETAL:  No edema; No deformity  SKIN: Warm and dry NEUROLOGIC:  Alert and oriented x 3 PSYCHIATRIC:  Normal affect    Signed, Norman Herrlich, MD  11/08/2020 1:02 PM    [facility]

## 2020-11-14 ENCOUNTER — Other Ambulatory Visit: Payer: Self-pay | Admitting: Internal Medicine

## 2020-11-14 DIAGNOSIS — M8580 Other specified disorders of bone density and structure, unspecified site: Secondary | ICD-10-CM

## 2020-11-14 DIAGNOSIS — Z981 Arthrodesis status: Secondary | ICD-10-CM | POA: Insufficient documentation

## 2020-11-23 ENCOUNTER — Telehealth: Payer: Self-pay | Admitting: *Deleted

## 2020-11-23 MED ORDER — METOPROLOL TARTRATE 25 MG PO TABS: 25.0000 mg | ORAL_TABLET | Freq: Two times a day (BID) | ORAL | 0 refills | Status: DC

## 2020-11-23 NOTE — Telephone Encounter (Signed)
Rx refill sent to pharmacy. 

## 2020-11-30 ENCOUNTER — Ambulatory Visit: Payer: Medicare PPO | Admitting: Cardiology

## 2021-01-19 NOTE — Progress Notes (Signed)
Cardiology Office Note:    Date:  01/20/2021   ID:  Andrea Ewing, DOB Mar 18, 1946, MRN [id number]  PCP:  Ralene Ok, MD  Cardiologist:  Norman Herrlich, MD    Referring MD: Ralene Ok, MD    ASSESSMENT:    1. Mild CAD   2. Essential hypertension   3. Mixed hyperlipidemia   4. Right upper quadrant abdominal pain    PLAN:    In order of problems listed above:  1. She has a history of mild nonobstructive CAD, she is doing well has had no anginal discomfort we will continue treatment including aspirin or high intensity statin beta-blocker and antihypertensives.  Her right upper quadrant complaints are not cardiac or anginal in nature I do not think she needs an ischemia evaluation at the EKG is reassuring at her request duplex of the gallbladder is ordered.  If findings of chronic gallbladder disease or cholelithiasis would benefit from surgical evaluation. 2. Stable BP at target continue antihypertensives including thiazide diuretic calcium channel blocker ACE 3. Continue statin recent lipids requested from her PCP office   Next appointment: 1 year   Medication Adjustments/Labs and Tests Ordered: Current medicines are reviewed at length with the patient today.  Concerns regarding medicines are outlined above.  No orders of the defined types were placed in this encounter.  No orders of the defined types were placed in this encounter.   Chief Complaint  Patient presents with   Follow-up   Coronary Artery Disease   Hypertension   Hyperlipidemia    History of Present Illness:    Andrea Ewing is a 75 y.o. female with a hx of mild CAD hypertension and hyperlipidemia.  She was last seen 09/30/2019.  Left heart catheterization 2017 showed mild nonobstructive CAD.  Compliance with diet, lifestyle and medications: Yes  Cardiologist perspective she is doing well tolerating statin without muscle pain or weakness BP at target and has had no angina.  No shortness of breath  palpitation or syncope. Overall she is troubled and has right upper quadrant and epigastric discomfort clearly related to certain foods such as broccoli or fatty meats and is restricting her diet.  She has a CT of the abdomen done I will request a copy I asked her about her gallbladder ultrasound was not done and she requested that I order it.  Her symptoms certainly could be biliary in etiology.  She has been seen by Dr. Jennye Boroughs GI.  She has not needed nitroglycerin  Left heart catheterization 01/17/2016: I reviewed the report and have placed it in epic for continuity of care Angiographic findings   Cardiac Arteries and Lesion Findings   LMCA: Mild luminal irregularities < 20%   LAD: 30 % mid LAD   LCx: LCX 30%   RCA: Mild luminal irregularities < 20%   LV function assessed [id number].   Ejection Fraction     - Method: LV gram. EF%: 55.    EKG performed at [facility] 09/06/2020 independently reviewed shows sinus rhythm inferior infarction low voltage EKG. Chest x-ray same date normal no acute findings she did have compression deformity in the thoracolumbar junction.  Past Medical History:  Diagnosis Date   Abnormal myocardial perfusion study 01/09/2016   Overview:  4.6 Mets, EKG normal. inferobasilar ischemia, EF 74%   Amaurosis fugax 02/06/2018   Arthritis    Carotid atherosclerosis 02/06/2018   Chest pain in adult 01/09/2016   Complication of anesthesia    Pt states BP drops after surgery,  in recovery   Degenerative scoliosis 09/22/2019   Degenerative spondylolisthesis 09/22/2019   Essential hypertension 02/06/2018   GERD (gastroesophageal reflux disease) 02/06/2018   Glaucoma 02/06/2018   Hyperlipidemia 02/06/2018   Hypothyroidism    Low back pain 07/27/2019   Migraine 02/06/2018   Mild CAD 01/30/2016   Overview:  Cardiac cath 01/17/16:Conclusions Diagnostic Procedure Summary Mild non obstructive disease in the LAD and LCX   Multinodular goiter 02/06/2018   Osteoporosis  02/06/2018    Past Surgical History:  Procedure Laterality Date   CARDIAC CATHETERIZATION  01/17/2016   COLONOSCOPY     LUMBAR LAMINECTOMY/DECOMPRESSION MICRODISCECTOMY N/A 10/15/2019   Procedure: Lumbar four through five Decompression insitu fusion;  Surgeon: Venita Lick, MD;  Location: [facility] OR;  Service: Orthopedics;  Laterality: N/A;  3.5 hrs   TUBAL LIGATION     WISDOM TOOTH EXTRACTION     only 2    Current Medications: Current Meds  Medication Sig   amLODipine-benazepril (LOTREL) 5-10 MG capsule Take 1 capsule by mouth daily.   aspirin EC 81 MG tablet Take 81 mg by mouth daily.   atorvastatin (LIPITOR) 20 MG tablet Take 20 mg by mouth daily after supper.    bimatoprost (LUMIGAN) 0.01 % SOLN Place 1 drop into both eyes at bedtime.   Calcium Carb-Cholecalciferol (CALCIUM 1000 + D) 1000-800 MG-UNIT TABS Take 1 tablet by mouth daily.   CREON 36000 units CPEP capsule Take 36,000 Units by mouth 3 (three) times daily with meals.    denosumab (PROLIA) 60 MG/ML SOSY injection Inject 60 mg into the skin every 6 (six) months.    dicyclomine (BENTYL) 10 MG capsule Take 10 mg by mouth daily as needed.   esomeprazole (NEXIUM) 40 MG capsule Take 40 mg by mouth daily.    famotidine (PEPCID) 40 MG tablet Take 40 mg by mouth at bedtime.    hydrochlorothiazide (HYDRODIURIL) 25 MG tablet Take 25 mg by mouth daily.   meloxicam (MOBIC) 7.5 MG tablet Take 1 tablet by mouth 2 (two) times daily as needed.   metoprolol tartrate (LOPRESSOR) 25 MG tablet Take 1 tablet (25 mg total) by mouth 2 (two) times daily.   Multiple Vitamin (MULTIVITAMIN) tablet Take 1 tablet by mouth daily.   nitroGLYCERIN (NITROSTAT) 0.4 MG SL tablet Place 0.4 mg under the tongue every 5 (five) minutes as needed for chest pain.    ondansetron (ZOFRAN) 4 MG tablet Take 1 tablet (4 mg total) by mouth every 8 (eight) hours as needed for nausea or vomiting.   polyethylene glycol (MIRALAX / GLYCOLAX) 17 g packet  Take 17 g by mouth daily as needed for moderate constipation.     Allergies:   Gabapentin   Social History   Socioeconomic History   Marital status: Married    Spouse name: Not on file   Number of children: Not on file   Years of education: Not on file   Highest education level: Not on file  Occupational History   Not on file  Tobacco Use   Smoking status: Never Smoker   Smokeless tobacco: Never Used  Vaping Use   Vaping Use: Never used  Substance and Sexual Activity   Alcohol use: No   Drug use: No   Sexual activity: Not on file    Comment: Tubal   Other Topics Concern   Not on file  Social History Narrative   Not on file   Social Determinants of Health   Financial Resource Strain: Not on file  Food  Insecurity: Not on file  Transportation Needs: Not on file  Physical Activity: Not on file  Stress: Not on file  Social Connections: Not on file     Family History: The patient's family history includes COPD in her mother; Diabetes in her brother, father, and mother; Heart disease in her maternal grandfather, maternal grandmother, mother, paternal grandmother, and paternal uncle. ROS:   Please see the history of present illness.    All other systems reviewed and are negative.  EKGs/Labs/Other Studies Reviewed:    The following studies were reviewed today:  EKG:  EKG ordered today and personally reviewed.  The ekg ordered today demonstrates sinus rhythm poor R wave progressionin the  precordial leads  Recent Labs: Requested from her PCP  Physical Exam:    VS:  BP 110/68    Pulse 70    Ht 5' 3.25" (1.607 m)    Wt 160 lb (72.6 kg)    LMP  (LMP Unknown) Comment: tubal ligation   SpO2 99%    BMI 28.12 kg/m     Wt Readings from Last 3 Encounters:  01/20/21 160 lb (72.6 kg)  10/15/19 175 lb 8 oz (79.6 kg)  10/12/19 175 lb 8 oz (79.6 kg)     GEN:  Well nourished, well developed in no acute distress HEENT: Normal NECK: No JVD; No carotid  bruits LYMPHATICS: No lymphadenopathy CARDIAC: RRR, no murmurs, rubs, gallops RESPIRATORY:  Clear to auscultation without rales, wheezing or rhonchi  ABDOMEN: Soft, non-tender, non-distended MUSCULOSKELETAL:  No edema; No deformity  SKIN: Warm and dry NEUROLOGIC:  Alert and oriented x 3 PSYCHIATRIC:  Normal affect    Signed, Norman Herrlich, MD  01/20/2021 4:23 PM    [facility]

## 2021-01-20 ENCOUNTER — Encounter: Payer: Self-pay | Admitting: Cardiology

## 2021-01-20 ENCOUNTER — Other Ambulatory Visit: Payer: Self-pay

## 2021-01-20 ENCOUNTER — Ambulatory Visit (INDEPENDENT_AMBULATORY_CARE_PROVIDER_SITE_OTHER): Payer: Medicare PPO | Admitting: Cardiology

## 2021-01-20 VITALS — BP 110/68 | HR 70 | Ht 63.25 in | Wt 160.0 lb

## 2021-01-20 DIAGNOSIS — R1011 Right upper quadrant pain: Secondary | ICD-10-CM | POA: Diagnosis not present

## 2021-01-20 DIAGNOSIS — I2510 Atherosclerotic heart disease of native coronary artery without angina pectoris: Secondary | ICD-10-CM

## 2021-01-20 DIAGNOSIS — E782 Mixed hyperlipidemia: Secondary | ICD-10-CM

## 2021-01-20 DIAGNOSIS — I10 Essential (primary) hypertension: Secondary | ICD-10-CM | POA: Diagnosis not present

## 2021-01-20 NOTE — Patient Instructions (Signed)
Medication Instructions:  Your physician recommends that you continue on your current medications as directed. Please refer to the Current Medication list given to you today.  *If you need a refill on your cardiac medications before your next appointment, please call your pharmacy*   Lab Work: NOne If you have labs (blood work) drawn today and your tests are completely normal, you will receive your results only by: Marland Kitchen MyChart Message (if you have MyChart) OR . A paper copy in the mail If you have any lab test that is abnormal or we need to change your treatment, we will call you to review the results.   Testing/Procedures: We have put in an order for you to have an ultrasound of your gallbladder. They will call you to schedule this appointment.    Follow-Up: At [facility], you and your health needs are our priority.  As part of our continuing mission to provide you with exceptional heart care, we have created designated Provider Care Teams.  These Care Teams include your primary Cardiologist (physician) and Advanced Practice Providers (APPs -  Physician Assistants and Nurse Practitioners) who all work together to provide you with the care you need, when you need it.  We recommend signing up for the patient portal called "MyChart".  Sign up information is provided on this After Visit Summary.  MyChart is used to connect with patients for Virtual Visits (Telemedicine).  Patients are able to view lab/test results, encounter notes, upcoming appointments, etc.  Non-urgent messages can be sent to your provider as well.   To learn more about what you can do with MyChart, go to [email or web address].    Your next appointment:   1 year(s)  The format for your next appointment:   In Person  Provider:   Norman Herrlich, MD   Other Instructions

## 2021-01-23 ENCOUNTER — Other Ambulatory Visit: Payer: Self-pay

## 2021-01-23 DIAGNOSIS — R1011 Right upper quadrant pain: Secondary | ICD-10-CM

## 2021-01-23 NOTE — Addendum Note (Signed)
Addended by: Smiley Houseman B on: 01/23/2021 09:08 AM   Modules accepted: Orders

## 2021-01-23 NOTE — Addendum Note (Signed)
Addended by: Delorse Limber I on: 01/23/2021 10:15 AM   Modules accepted: Orders

## 2021-01-26 ENCOUNTER — Telehealth: Payer: Self-pay

## 2021-01-26 ENCOUNTER — Encounter: Payer: Self-pay | Admitting: Cardiology

## 2021-01-26 NOTE — Telephone Encounter (Signed)
Left message on patients voicemail to please return our call.   Per Dr. Dulce Sellar:  Gallbladder ultrasound is normal

## 2021-02-17 ENCOUNTER — Telehealth: Payer: Self-pay | Admitting: *Deleted

## 2021-02-17 MED ORDER — METOPROLOL TARTRATE 25 MG PO TABS: 25.0000 mg | ORAL_TABLET | Freq: Two times a day (BID) | ORAL | 3 refills | Status: DC

## 2021-02-17 NOTE — Telephone Encounter (Signed)
Rx refill sent to pharmacy. 

## 2022-01-09 HISTORY — PX: CATARACT EXTRACTION W/ INTRAOCULAR LENS IMPLANT: SHX1309

## 2022-01-31 ENCOUNTER — Other Ambulatory Visit: Payer: Self-pay

## 2022-02-01 ENCOUNTER — Other Ambulatory Visit: Payer: Self-pay

## 2022-02-01 ENCOUNTER — Encounter: Payer: Self-pay | Admitting: Cardiology

## 2022-02-01 ENCOUNTER — Ambulatory Visit: Payer: Medicare PPO | Admitting: Cardiology

## 2022-02-01 VITALS — BP 106/61 | HR 57 | Ht 63.0 in | Wt 154.2 lb

## 2022-02-01 DIAGNOSIS — I2510 Atherosclerotic heart disease of native coronary artery without angina pectoris: Secondary | ICD-10-CM

## 2022-02-01 DIAGNOSIS — I10 Essential (primary) hypertension: Secondary | ICD-10-CM

## 2022-02-01 DIAGNOSIS — G459 Transient cerebral ischemic attack, unspecified: Secondary | ICD-10-CM | POA: Diagnosis not present

## 2022-02-01 DIAGNOSIS — E782 Mixed hyperlipidemia: Secondary | ICD-10-CM

## 2022-02-01 MED ORDER — NITROGLYCERIN 0.4 MG SL SUBL: 0.4000 mg | SUBLINGUAL_TABLET | SUBLINGUAL | 3 refills | Status: DC | PRN

## 2022-02-01 NOTE — Progress Notes (Signed)
Cardiology Office Note:    Date:  02/01/2022   ID:  Andrea Ewing, DOB April 21, 1946, MRN [id number]  PCP:  Ralene OkMoreira, Roy, MD  Cardiologist:  Norman HerrlichBrian Ayla Dunigan, MD    Referring MD: Ralene OkMoreira, Roy, MD    ASSESSMENT:    1. Mild CAD   2. Essential hypertension   3. Mixed hyperlipidemia   4. TIA (transient ischemic attack)    PLAN:    In order of problems listed above:  Harriett Sineancy continues to do well with CAD New York Heart Association class I having no angina continue therapy including aspirin or high intensity statin beta-blocker and antihypertensive.  At this time I would not repeat an ischemic evaluation Stable BP at target continue treatment including her ACE inhibitor calcium channel blocker combination Continue high intensity statin we will request a copy of her recent labs.  LDL target We will check a carotid duplex for further evaluation I asked her also to use her apple watch and to activate atrial fibrillation detection   Next appointment: 1 year   Medication Adjustments/Labs and Tests Ordered: Current medicines are reviewed at length with the patient today.  Concerns regarding medicines are outlined above.  No orders of the defined types were placed in this encounter.  No orders of the defined types were placed in this encounter. Chief complaint follow-up for CAD   History of Present Illness:    Andrea Heftyancy C Yarde is a 76 y.o. female with a hx of mild nonobstructive CAD hypertension and hyperlipidemia last seen 01/20/2021. Compliance with diet, lifestyle and medications: Yes  She has routine labs done on Tums.  Lipids are on target I cannot see them in epic I will request them from her PCP She tolerates her statin without muscle pain or weakness She has had no palpitation chest pain edema shortness of breath or syncope In December she had an episode of left-sided numbness was also having migraine headache went to the emergency room tells me she had a very extensive evaluation  including CT and was told her symptoms were due to migraine. She is concerned that she may have risk of stroke and we will go ahead and do a carotid duplex in the office for reassurance.  She had severe flow-limiting stenosis would benefit from revascularization  Left heart catheterization 01/17/2016: I reviewed the report and have placed it in epic for continuity of care Angiographic findings   Cardiac Arteries and Lesion Findings   LMCA: Mild luminal irregularities < 20%   LAD: 30 % mid LAD   LCx: LCX 30%   RCA: Mild luminal irregularities < 20%   LV function assessed WU:JWJXBJas:Normal.   Ejection Fraction     - Method: LV gram. EF%: 55.   Past Medical History:  Diagnosis Date   Abnormal myocardial perfusion study 01/09/2016   Overview:  4.6 Mets, EKG normal. inferobasilar ischemia, EF 74%   Amaurosis fugax 02/06/2018   Arthritis    Carotid atherosclerosis 02/06/2018   Chest pain in adult 01/09/2016   Complication of anesthesia    Pt states BP drops after surgery, in recovery   Degenerative scoliosis 09/22/2019   Degenerative spondylolisthesis 09/22/2019   Essential hypertension 02/06/2018   GERD (gastroesophageal reflux disease) 02/06/2018   Glaucoma 02/06/2018   Hyperlipidemia 02/06/2018   Hypothyroidism    Low back pain 07/27/2019   Migraine 02/06/2018   Mild CAD 01/30/2016   Overview:  Cardiac cath 01/17/16:Conclusions Diagnostic Procedure Summary Mild non obstructive disease in the LAD and LCX  Multinodular goiter 02/06/2018   Osteoporosis 02/06/2018    Past Surgical History:  Procedure Laterality Date   CARDIAC CATHETERIZATION  01/17/2016   COLONOSCOPY     LUMBAR LAMINECTOMY/DECOMPRESSION MICRODISCECTOMY N/A 10/15/2019   Procedure: Lumbar four through five Decompression insitu fusion;  Surgeon: Venita Lick, MD;  Location: [facility] OR;  Service: Orthopedics;  Laterality: N/A;  3.5 hrs   TUBAL LIGATION     WISDOM TOOTH EXTRACTION     only 2    Current Medications: Current Meds  Medication Sig    amLODipine-benazepril (LOTREL) 5-10 MG capsule Take 1 capsule by mouth daily.   aspirin 81 MG EC tablet Take 1 tablet by mouth daily.   aspirin EC 81 MG tablet Take 81 mg by mouth daily.   atorvastatin (LIPITOR) 20 MG tablet Take 20 mg by mouth daily after supper.    bimatoprost (LUMIGAN) 0.01 % SOLN Place 1 drop into both eyes at bedtime.   Calcium Carb-Cholecalciferol (CALCIUM 1000 + D) 1000-800 MG-UNIT TABS Take 1 tablet by mouth daily.   denosumab (PROLIA) 60 MG/ML SOSY injection Inject 60 mg into the skin every 6 (six) months.    dicyclomine (BENTYL) 10 MG capsule Take 10 mg by mouth daily as needed.   esomeprazole (NEXIUM) 40 MG capsule Take 40 mg by mouth daily.    famotidine (PEPCID) 40 MG tablet Take 40 mg by mouth at bedtime.    hydrochlorothiazide (HYDRODIURIL) 25 MG tablet Take 25 mg by mouth daily.   metoprolol tartrate (LOPRESSOR) 25 MG tablet Take 1 tablet (25 mg total) by mouth 2 (two) times daily.   Multiple Vitamin (MULTIVITAMIN) tablet Take 1 tablet by mouth daily.   nitroGLYCERIN (NITROSTAT) 0.4 MG SL tablet Place 0.4 mg under the tongue every 5 (five) minutes as needed for chest pain.    ondansetron (ZOFRAN) 4 MG tablet Take 1 tablet (4 mg total) by mouth every 8 (eight) hours as needed for nausea or vomiting.   polyethylene glycol (MIRALAX / GLYCOLAX) 17 g packet Take 17 g by mouth daily as needed for moderate constipation.     Allergies:   Gabapentin   Social History   Socioeconomic History   Marital status: Married    Spouse name: Not on file   Number of children: Not on file   Years of education: Not on file   Highest education level: Not on file  Occupational History   Not on file  Tobacco Use   Smoking status: Never   Smokeless tobacco: Never  Vaping Use   Vaping Use: Never used  Substance and Sexual Activity   Alcohol use: No   Drug use: No   Sexual activity: Not on file    Comment: Tubal   Other Topics Concern   Not on file  Social History  Narrative   Not on file   Social Determinants of Health   Financial Resource Strain: Not on file  Food Insecurity: Not on file  Transportation Needs: Not on file  Physical Activity: Not on file  Stress: Not on file  Social Connections: Not on file     Family History: The patient's family history includes COPD in her mother; Diabetes in her brother, father, and mother; Heart disease in her maternal grandfather, maternal grandmother, mother, paternal grandmother, and paternal uncle. ROS:   Please see the history of present illness.    All other systems reviewed and are negative.  EKGs/Labs/Other Studies Reviewed:    The following studies were reviewed today:  EKG:  EKG ordered today and personally reviewed.  The ekg ordered today demonstrates sinus rhythm 57 bpm otherwise normal EKG  Recent Labs: Requested from her PCP not available in epic Physical Exam:    VS:  BP 106/61    Pulse (!) 57    Ht 5\' 3"  (1.6 m)    Wt 154 lb 3.2 oz (69.9 kg)    LMP  (LMP Unknown) Comment: tubal ligation   SpO2 97%    BMI 27.32 kg/m     Wt Readings from Last 3 Encounters:  02/01/22 154 lb 3.2 oz (69.9 kg)  01/20/21 160 lb (72.6 kg)  10/15/19 175 lb 8 oz (79.6 kg)     GEN:  Well nourished, well developed in no acute distress HEENT: Normal NECK: No JVD; No carotid bruits LYMPHATICS: No lymphadenopathy CARDIAC: RRR, no murmurs, rubs, gallops RESPIRATORY:  Clear to auscultation without rales, wheezing or rhonchi  ABDOMEN: Soft, non-tender, non-distended MUSCULOSKELETAL:  No edema; No deformity  SKIN: Warm and dry NEUROLOGIC:  Alert and oriented x 3 PSYCHIATRIC:  Normal affect    Signed, 10/17/19, MD  02/01/2022 10:36 AM    [facility]

## 2022-02-01 NOTE — Patient Instructions (Signed)
Medication Instructions:  Your physician recommends that you continue on your current medications as directed. Please refer to the Current Medication list given to you today.  *If you need a refill on your cardiac medications before your next appointment, please call your pharmacy*   Lab Work: None If you have labs (blood work) drawn today and your tests are completely normal, you will receive your results only by: MyChart Message (if you have MyChart) OR A paper copy in the mail If you have any lab test that is abnormal or we need to change your treatment, we will call you to review the results.   Testing/Procedures: Your physician has requested that you have a carotid duplex. This test is an ultrasound of the carotid arteries in your neck. It looks at blood flow through these arteries that supply the brain with blood. Allow one hour for this exam. There are no restrictions or special instructions.    Follow-Up: At [facility], you and your health needs are our priority.  As part of our continuing mission to provide you with exceptional heart care, we have created designated Provider Care Teams.  These Care Teams include your primary Cardiologist (physician) and Advanced Practice Providers (APPs -  Physician Assistants and Nurse Practitioners) who all work together to provide you with the care you need, when you need it.  We recommend signing up for the patient portal called "MyChart".  Sign up information is provided on this After Visit Summary.  MyChart is used to connect with patients for Virtual Visits (Telemedicine).  Patients are able to view lab/test results, encounter notes, upcoming appointments, etc.  Non-urgent messages can be sent to your provider as well.   To learn more about what you can do with MyChart, go to [email or web address].    Your next appointment:   1 year(s)  The format for your next appointment:   In Person  Provider:   Brian Munley, MD   Other  Instructions   

## 2022-02-01 NOTE — Addendum Note (Signed)
Addended by: Delorse Limber I on: 02/01/2022 10:41 AM   Modules accepted: Orders

## 2022-02-08 ENCOUNTER — Ambulatory Visit (INDEPENDENT_AMBULATORY_CARE_PROVIDER_SITE_OTHER): Payer: Medicare PPO

## 2022-02-08 ENCOUNTER — Other Ambulatory Visit: Payer: Self-pay

## 2022-02-08 DIAGNOSIS — G459 Transient cerebral ischemic attack, unspecified: Secondary | ICD-10-CM | POA: Diagnosis not present

## 2022-02-14 ENCOUNTER — Other Ambulatory Visit: Payer: Self-pay

## 2022-02-14 MED ORDER — METOPROLOL TARTRATE 25 MG PO TABS: 25.0000 mg | ORAL_TABLET | Freq: Two times a day (BID) | ORAL | 3 refills | Status: DC

## 2023-02-20 ENCOUNTER — Other Ambulatory Visit: Payer: Self-pay | Admitting: Cardiology

## 2023-04-02 ENCOUNTER — Encounter: Payer: Self-pay | Admitting: *Deleted

## 2023-04-08 ENCOUNTER — Other Ambulatory Visit: Payer: Self-pay | Admitting: Cardiology

## 2023-04-09 ENCOUNTER — Ambulatory Visit: Payer: Medicare PPO | Admitting: Cardiology

## 2023-06-02 ENCOUNTER — Other Ambulatory Visit: Payer: Self-pay | Admitting: Cardiology

## 2023-06-03 NOTE — Progress Notes (Unsigned)
Cardiology Office Note:    Date:  06/04/2023   ID:  Andrea Ewing, DOB [birth year], MRN [id number]  PCP:  Ralene Ok, MD  Cardiologist:  Norman Herrlich, MD    Referring MD: Ralene Ok, MD    ASSESSMENT:    1. Mild CAD   2. Essential hypertension   3. Mixed hyperlipidemia   4. TIA (transient ischemic attack)    PLAN:    In order of problems listed above:  Kikuko continues to do well with CAD having no anginal discomfort stable New York Heart Association class I she will continue aspirin intensity statin metoprolol as well as calcium channel blocker which is used for hypertension.  At this time I would not repeat an ischemia evaluation and should be given a prescription for new nitroglycerin. Well-controlled continue her combination including diuretic ACE calcium channel blocker and thiazide diuretic Lipids are stable LDL at target continue her high intensity statin with CAD No recurrence continue current medical therapy aspirin antihypertensive lipid-lowering with statin   Next appointment: 1 year   Medication Adjustments/Labs and Tests Ordered: Current medicines are reviewed at length with the patient today.  Concerns regarding medicines are outlined above.  No orders of the defined types were placed in this encounter.  No orders of the defined types were placed in this encounter.   Chief Complaint  Patient presents with   Follow-up  For CAD  History of Present Illness:    Andrea Ewing is a 77 y.o. female with a hx of mild nonobstructive CAD hypertension hyperlipidemia and TIA last seen 02/01/2022.  She had a carotid duplex performed 02/08/2022 with no finding of cerebrovascular atherosclerosis  Compliance with diet, lifestyle and medications: Yes  She is quite trying troubled and frustrated by allergies and advised to see Dr. Lucie Leather allergist [place] Has had no angina edema shortness of breath chest pain palpitation or syncope Tolerates his statin without  muscle pain or weakness and has labs routinely checked with her PCP She was seen by neurology had EMG nerve conduction studies and has had no further neurologic symptoms most recent lipid profile 03/26/2023 cholesterol 138 LDL 54 triglycerides 74 CMP creatinine 1.04 normal liver function test GFR 55 cc/min Past Medical History:  Diagnosis Date   Abnormal myocardial perfusion study 01/09/2016   Overview:  4.6 Mets, EKG normal. inferobasilar ischemia, EF 74%   Amaurosis fugax 02/06/2018   Arthritis    Carotid atherosclerosis 02/06/2018   Chest pain in adult 01/09/2016   Complication of anesthesia    Pt states BP drops after surgery, in recovery   Degenerative scoliosis 09/22/2019   Degenerative spondylolisthesis 09/22/2019   Essential hypertension 02/06/2018   GERD (gastroesophageal reflux disease) 02/06/2018   Glaucoma 02/06/2018   Hyperlipidemia 02/06/2018   Hypothyroidism    Low back pain 07/27/2019   Migraine 02/06/2018   Mild CAD 01/30/2016   Overview:  Cardiac cath 01/17/16:Conclusions Diagnostic Procedure Summary Mild non obstructive disease in the LAD and LCX   Multinodular goiter 02/06/2018   Osteoporosis 02/06/2018    Past Surgical History:  Procedure Laterality Date   CARDIAC CATHETERIZATION  01/17/2016   CATARACT EXTRACTION W/ INTRAOCULAR LENS IMPLANT Right 01/09/2022   COLONOSCOPY     LUMBAR LAMINECTOMY/DECOMPRESSION MICRODISCECTOMY N/A 10/15/2019   Procedure: Lumbar four through five Decompression insitu fusion;  Surgeon: Venita Lick, MD;  Location: [facility] OR;  Service: Orthopedics;  Laterality: N/A;  3.5 hrs   TUBAL LIGATION     WISDOM TOOTH EXTRACTION  only 2    Current Medications: Current Meds  Medication Sig   amLODipine-benazepril (LOTREL) 5-10 MG capsule Take 1 capsule by mouth daily.   aspirin 81 MG EC tablet Take 1 tablet by mouth daily.   atorvastatin (LIPITOR) 20 MG tablet Take 20 mg by mouth daily after supper.    bimatoprost (LUMIGAN) 0.01 % SOLN Place 1 drop into both  eyes at bedtime.   Calcium Carb-Cholecalciferol (CALCIUM 1000 + D) 1000-800 MG-UNIT TABS Take 1 tablet by mouth daily.   denosumab (PROLIA) 60 MG/ML SOSY injection Inject 60 mg into the skin every 6 (six) months.    dicyclomine (BENTYL) 10 MG capsule Take 10 mg by mouth daily as needed for spasms.   esomeprazole (NEXIUM) 40 MG capsule Take 40 mg by mouth daily.    famotidine (PEPCID) 40 MG tablet Take 40 mg by mouth at bedtime.    hydrochlorothiazide (HYDRODIURIL) 25 MG tablet Take 25 mg by mouth daily.   metoprolol tartrate (LOPRESSOR) 25 MG tablet Take 1 tablet (25 mg total) by mouth 2 (two) times daily. Patient must [facility] 06/04/23 appointment for further refills. 3 rd/final attempt   Multiple Vitamin (MULTIVITAMIN) tablet Take 1 tablet by mouth daily.   ondansetron (ZOFRAN) 4 MG tablet Take 1 tablet (4 mg total) by mouth every 8 (eight) hours as needed for nausea or vomiting.   polyethylene glycol (MIRALAX / GLYCOLAX) 17 g packet Take 17 g by mouth daily as needed for moderate constipation.   [DISCONTINUED] nitroGLYCERIN (NITROSTAT) 0.4 MG SL tablet Place 1 tablet (0.4 mg total) under the tongue every 5 (five) minutes as needed for chest pain.     Allergies:   Gabapentin   Social History   Socioeconomic History   Marital status: Married    Spouse name: Not on file   Number of children: Not on file   Years of education: Not on file   Highest education level: Not on file  Occupational History   Not on file  Tobacco Use   Smoking status: Never   Smokeless tobacco: Never  Vaping Use   Vaping Use: Never used  Substance and Sexual Activity   Alcohol use: No   Drug use: No   Sexual activity: Not on file    Comment: Tubal   Other Topics Concern   Not on file  Social History Narrative   Not on file   Social Determinants of Health   Financial Resource Strain: Not on file  Food Insecurity: Not on file  Transportation Needs: Not on file  Physical Activity: Not on file  Stress: Not  on file  Social Connections: Not on file     Family History: The patient's family history includes COPD in her mother; Diabetes in her brother, father, and mother; Heart disease in her maternal grandfather, maternal grandmother, mother, paternal grandmother, and paternal uncle. ROS:   Please see the history of present illness.    All other systems reviewed and are negative.  EKGs/Labs/Other Studies Reviewed:    The following studies were reviewed today:    Left heart catheterization 01/17/2016: I reviewed the report and have placed it in epic for continuity of care Angiographic findings   Cardiac Arteries and Lesion Findings   LMCA: Mild luminal irregularities < 20%   LAD: 30 % mid LAD   LCx: LCX 30%   RCA: Mild luminal irregularities < 20%   LV function assessed QM:VHQION.   Ejection Fraction     - Method: LV gram. EF%:  55.    EKG:  EKG ordered today and personally reviewed.  The ekg ordered today demonstrates sinus rhythm normal EKG  Recent Labs: No results found for requested labs within last 365 days.  Recent Lipid Panel No results found for: "CHOL", "TRIG", "HDL", "CHOLHDL", "VLDL", "LDLCALC", "LDLDIRECT"  Physical Exam:    VS:  BP 102/60 (BP Location: Right Arm, Patient Position: Sitting)   Pulse (!) 59   Ht 5' 3.25" (1.607 m)   Wt 154 lb (69.9 kg)   LMP  (LMP Unknown) Comment: tubal ligation  SpO2 97%   BMI 27.06 kg/m     Wt Readings from Last 3 Encounters:  06/04/23 154 lb (69.9 kg)  02/01/22 154 lb 3.2 oz (69.9 kg)  01/20/21 160 lb (72.6 kg)     GEN:  Well nourished, well developed in no acute distress HEENT: Normal NECK: No JVD; No carotid bruits LYMPHATICS: No lymphadenopathy CARDIAC: RRR, no murmurs, rubs, gallops RESPIRATORY:  Clear to auscultation without rales, wheezing or rhonchi  ABDOMEN: Soft, non-tender, non-distended MUSCULOSKELETAL:  No edema; No deformity  SKIN: Warm and dry NEUROLOGIC:  Alert and oriented x 3 PSYCHIATRIC:  Normal  affect    Signed, Norman Herrlich, MD  06/04/2023 11:45 AM    [facility]

## 2023-06-04 ENCOUNTER — Ambulatory Visit: Payer: Medicare PPO | Attending: Cardiology | Admitting: Cardiology

## 2023-06-04 ENCOUNTER — Other Ambulatory Visit: Payer: Self-pay

## 2023-06-04 ENCOUNTER — Encounter: Payer: Self-pay | Admitting: Cardiology

## 2023-06-04 VITALS — BP 102/60 | HR 59 | Ht 63.25 in | Wt 154.0 lb

## 2023-06-04 DIAGNOSIS — I10 Essential (primary) hypertension: Secondary | ICD-10-CM | POA: Diagnosis not present

## 2023-06-04 DIAGNOSIS — G459 Transient cerebral ischemic attack, unspecified: Secondary | ICD-10-CM

## 2023-06-04 DIAGNOSIS — E782 Mixed hyperlipidemia: Secondary | ICD-10-CM

## 2023-06-04 DIAGNOSIS — I2510 Atherosclerotic heart disease of native coronary artery without angina pectoris: Secondary | ICD-10-CM | POA: Diagnosis not present

## 2023-06-04 MED ORDER — NITROGLYCERIN 0.4 MG SL SUBL: 0.4000 mg | SUBLINGUAL_TABLET | SUBLINGUAL | 3 refills | Status: DC | PRN

## 2023-06-04 NOTE — Patient Instructions (Signed)
Medication Instructions:  No Changes *If you need a refill on your cardiac medications before your next appointment, please call your pharmacy*   Lab Work: None If you have labs (blood work) drawn today and your tests are completely normal, you will receive your results only by: MyChart Message (if you have MyChart) OR A paper copy in the mail If you have any lab test that is abnormal or we need to change your treatment, we will call you to review the results.   Testing/Procedures: None   Follow-Up: At [facility], you and your health needs are our priority.  As part of our continuing mission to provide you with exceptional heart care, we have created designated Provider Care Teams.  These Care Teams include your primary Cardiologist (physician) and Advanced Practice Providers (APPs -  Physician Assistants and Nurse Practitioners) who all work together to provide you with the care you need, when you need it.  We recommend signing up for the patient portal called "MyChart".  Sign up information is provided on this After Visit Summary.  MyChart is used to connect with patients for Virtual Visits (Telemedicine).  Patients are able to view lab/test results, encounter notes, upcoming appointments, etc.  Non-urgent messages can be sent to your provider as well.   To learn more about what you can do with MyChart, go to [email or web address].    Your next appointment:   1 year(s)  Provider:   Norman Herrlich, MD    Other Instructions Renew NTG

## 2023-09-02 ENCOUNTER — Other Ambulatory Visit: Payer: Self-pay | Admitting: Cardiology

## 2023-09-04 ENCOUNTER — Other Ambulatory Visit: Payer: Self-pay | Admitting: Cardiology

## 2023-10-27 DIAGNOSIS — R079 Chest pain, unspecified: Secondary | ICD-10-CM | POA: Diagnosis not present

## 2023-10-28 DIAGNOSIS — R079 Chest pain, unspecified: Secondary | ICD-10-CM | POA: Diagnosis not present

## 2024-05-30 ENCOUNTER — Other Ambulatory Visit: Payer: Self-pay | Admitting: Cardiology

## 2024-06-26 ENCOUNTER — Other Ambulatory Visit: Payer: Self-pay | Admitting: Cardiology

## 2024-06-29 NOTE — Progress Notes (Signed)
 " Cardiology Office Note   Date:  07/01/2024  ID:  Andrea Ewing, Andrea Ewing 1946/06/17, MRN [id number] PCP: Valma Carwin, MD  [facility] Providers Cardiologist:  Redell Leiter, MD     History of Present Illness Andrea Ewing is a 78 y.o. female with a past medical history of hypertension, migraine, nonobstructive CAD, amaurosis fugax, hypothyroidism, GERD, dyslipidemia.  10/28/2023 Lexiscan uneventful 11/01/2023 echo EF 60-65%, impaired relaxation, trivial MR 02/08/2022 carotid duplex no stenosis 01/17/2016 left heart cath mild nonobstructive disease in the LAD and left circumflex  She established care with Dr. Leiter initially in 2017, she had had a left heart cath revealing mild nonobstructive CAD.  In June or 2024 she was evaluated by Dr. Leiter, she was stable from a cardiac perspective, no changes were made to her medications or plan of care and she is advised to follow-up in 1 year.  Today for follow-up of her hypertension and nonobstructive CAD.  She mention she had a syncopal episode 5 weeks ago, sounds to be consistent with orthostasis, she was sitting at her desk stood up, and then passed out, when she woke up she went checked her blood pressure and noticed that it was low.  She checks her blood pressure regularly, and has noticed her systolic has been as low as the 80s and she is also symptomatic.  For now we will simply discontinue her HCTZ and have her keep a blood pressure log for 2 weeks to see if we need to make any further adjustments to her medication regimen. She denies chest pain, palpitations, dyspnea, pnd, orthopnea, n, v,  edema, weight gain, or early satiety.    ROS: Review of Systems  Neurological:  Positive for dizziness and loss of consciousness.  All other systems reviewed and are negative.    Studies Reviewed EKG Interpretation Date/Time:  Wednesday July 01 2024 08:01:26 EDT Ventricular Rate:  58 PR Interval:  186 QRS Duration:  86 QT Interval:  432 QTC  Calculation: 424 R Axis:   -8  Text Interpretation: Sinus bradycardia Minimal voltage criteria for LVH, may be normal variant Borderline ECG When compared with ECG of 12-Oct-2019 11:41, No significant change was found Confirmed by Carlin Nest [phone]) on 07/01/2024 8:04:45 AM    Cardiac Studies & Procedures   ______________________________________________________________________________________________   STRESS TESTS  MYOCARDIAL PERFUSION IMAGING 10/28/2023            ______________________________________________________________________________________________      Risk Assessment/Calculations           Physical Exam VS:  BP 110/62 (BP Location: Left Arm, Patient Position: Sitting)   Pulse (!) 58   Ht 5' 3.25 (1.607 m)   Wt 142 lb 9.6 oz (64.7 kg)   LMP  (LMP Unknown) Comment: tubal ligation  SpO2 92%   BMI 25.06 kg/m        Wt Readings from Last 3 Encounters:  07/01/24 142 lb 9.6 oz (64.7 kg)  06/04/23 154 lb (69.9 kg)  02/01/22 154 lb 3.2 oz (69.9 kg)    GEN: Well-groomed, well nourished, well developed in no acute distress NECK: No JVD; No carotid bruits CARDIAC: RRR, no murmurs, rubs, gallops RESPIRATORY:  Clear to auscultation without rales, wheezing or rhonchi  ABDOMEN: Soft, non-tender, non-distended EXTREMITIES:  No edema; No deformity   ASSESSMENT AND PLAN CAD - mild, non-obstructive per [facility] in 2017; continue aspirin 81 mg daily, nitroglycerin  as needed, metoprolol  tartrate 25 mg twice daily. Stable with no anginal symptoms. No indication for  ischemic evaluation.  Heart healthy diet and regular cardiovascular exercise encouraged.    Hypertension -her blood pressure is controlled today at 110/62 however she had episode of syncope as outlined above in the HPI, we will can discontinue her HCTZ for now we will continue Lotrel 5-10 mg daily and have her keep a blood pressure log for 2 weeks to see if we need to make any further changes to her antihypertensive  regimen.  If her blood pressure is still running on the low normal side and she is symptomatic we will decrease her Lotrel.  Dyslipidemia-this is formally monitored by her PCP, currently on Lipitor 20 mg daily.       Dispo: Discontinue HCTZ, blood pressure log x 2 weeks, follow-up in 1 year.  Signed, Delon JAYSON Hoover, NP  "

## 2024-07-01 ENCOUNTER — Ambulatory Visit: Attending: Cardiology | Admitting: Cardiology

## 2024-07-01 VITALS — BP 110/62 | HR 58 | Ht 63.25 in | Wt 142.6 lb

## 2024-07-01 DIAGNOSIS — I2510 Atherosclerotic heart disease of native coronary artery without angina pectoris: Secondary | ICD-10-CM

## 2024-07-01 DIAGNOSIS — E782 Mixed hyperlipidemia: Secondary | ICD-10-CM

## 2024-07-01 DIAGNOSIS — I10 Essential (primary) hypertension: Secondary | ICD-10-CM | POA: Diagnosis not present

## 2024-07-01 DIAGNOSIS — G459 Transient cerebral ischemic attack, unspecified: Secondary | ICD-10-CM

## 2024-07-01 NOTE — Patient Instructions (Signed)
 Medication Instructions:   STOP: hydrochlorothiazide     Lab Work: None Ordered If you have labs (blood work) drawn today and your tests are completely normal, you will receive your results only by: MyChart Message (if you have MyChart) OR A paper copy in the mail If you have any lab test that is abnormal or we need to change your treatment, we will call you to review the results.   Testing/Procedures: None Ordered   Follow-Up: At [facility], you and your health needs are our priority.  As part of our continuing mission to provide you with exceptional heart care, we have created designated Provider Care Teams.  These Care Teams include your primary Cardiologist (physician) and Advanced Practice Providers (APPs -  Physician Assistants and Nurse Practitioners) who all work together to provide you with the care you need, when you need it.  We recommend signing up for the patient portal called MyChart.  Sign up information is provided on this After Visit Summary.  MyChart is used to connect with patients for Virtual Visits (Telemedicine).  Patients are able to view lab/test results, encounter notes, upcoming appointments, etc.  Non-urgent messages can be sent to your provider as well.   To learn more about what you can do with MyChart, go to [email or web address].    Your next appointment:   12 month(s) with Dr. Monetta  The format for your next appointment:   In Person  Provider:   Delon Hoover, NP   Other Instructions NA

## 2024-07-02 ENCOUNTER — Ambulatory Visit: Admitting: Cardiology

## 2024-07-06 ENCOUNTER — Other Ambulatory Visit: Payer: Self-pay | Admitting: Cardiology

## 2024-07-16 ENCOUNTER — Other Ambulatory Visit: Payer: Self-pay | Admitting: Cardiology

## 2024-08-03 ENCOUNTER — Other Ambulatory Visit: Payer: Self-pay | Admitting: Cardiology

## 2024-08-28 ENCOUNTER — Telehealth: Payer: Self-pay | Admitting: Cardiology

## 2024-08-28 MED ORDER — METOPROLOL TARTRATE 25 MG PO TABS: 25.0000 mg | ORAL_TABLET | Freq: Two times a day (BID) | ORAL | 3 refills | Status: AC

## 2024-08-28 NOTE — Telephone Encounter (Signed)
 Pt's medication was sent to pt's pharmacy as requested. Confirmation received.

## 2024-08-28 NOTE — Telephone Encounter (Signed)
*  STAT* If patient is at the pharmacy, call can be transferred to refill team.   1. Which medications need to be refilled? (please list name of each medication and dose if known) metoprolol  tartrate (LOPRESSOR ) 25 MG tablet    2. Would you like to learn more about the convenience, safety, & potential cost savings by using the [facility]? NO   3. Are you open to using the [facility] NO   4. Which pharmacy/location (including street and city if local pharmacy) is medication to be sent to?  [id number] - [place], [place] - [address]     5. Do they need a 30 day or 90 day supply? 90  Patient had appt on 07/02 with Delon Hoover but medication was never refilled. She only has 2 tablets left.
